# Patient Record
Sex: Female | Born: 1975 | Race: White | Hispanic: No | Marital: Married | Smoking: Never smoker
Health system: Southern US, Community
[De-identification: ages and names within clinical notes are randomized; demographics above are authoritative.]

## PROBLEM LIST (undated history)

## (undated) DIAGNOSIS — K900 Celiac disease: Secondary | ICD-10-CM

## (undated) DIAGNOSIS — A6920 Lyme disease, unspecified: Secondary | ICD-10-CM

## (undated) DIAGNOSIS — E878 Other disorders of electrolyte and fluid balance, not elsewhere classified: Secondary | ICD-10-CM

## (undated) DIAGNOSIS — I10 Essential (primary) hypertension: Secondary | ICD-10-CM

## (undated) DIAGNOSIS — E119 Type 2 diabetes mellitus without complications: Secondary | ICD-10-CM

## (undated) DIAGNOSIS — E7800 Pure hypercholesterolemia, unspecified: Secondary | ICD-10-CM

## (undated) HISTORY — PX: ABDOMINAL HYSTERECTOMY: SHX81

---

## 2014-12-10 ENCOUNTER — Other Ambulatory Visit: Payer: Self-pay | Admitting: Gastroenterology

## 2014-12-10 DIAGNOSIS — R110 Nausea: Secondary | ICD-10-CM

## 2015-01-03 ENCOUNTER — Ambulatory Visit (HOSPITAL_COMMUNITY): Payer: Self-pay

## 2015-01-03 ENCOUNTER — Encounter (HOSPITAL_COMMUNITY): Payer: Self-pay

## 2015-01-17 ENCOUNTER — Encounter (HOSPITAL_COMMUNITY): Admission: RE | Admit: 2015-01-17 | Payer: BLUE CROSS/BLUE SHIELD | Source: Ambulatory Visit

## 2015-01-17 ENCOUNTER — Ambulatory Visit (HOSPITAL_COMMUNITY): Payer: BLUE CROSS/BLUE SHIELD

## 2015-01-24 ENCOUNTER — Other Ambulatory Visit (HOSPITAL_COMMUNITY): Payer: Self-pay

## 2015-02-25 ENCOUNTER — Encounter (HOSPITAL_COMMUNITY): Admission: RE | Admit: 2015-02-25 | Payer: BLUE CROSS/BLUE SHIELD | Source: Ambulatory Visit

## 2015-02-25 ENCOUNTER — Ambulatory Visit (HOSPITAL_COMMUNITY): Admission: RE | Admit: 2015-02-25 | Payer: BLUE CROSS/BLUE SHIELD | Source: Ambulatory Visit

## 2020-12-10 ENCOUNTER — Telehealth: Payer: Self-pay

## 2020-12-10 NOTE — Telephone Encounter (Signed)
Received a referral on this pt stating she may be interested in the prevail trial. Tried to call pt, NA. LMOM asking her to return call.

## 2021-03-29 ENCOUNTER — Emergency Department (HOSPITAL_COMMUNITY): Payer: BC Managed Care – PPO

## 2021-03-29 ENCOUNTER — Other Ambulatory Visit: Payer: Self-pay

## 2021-03-29 ENCOUNTER — Emergency Department (HOSPITAL_COMMUNITY)
Admission: EM | Admit: 2021-03-29 | Discharge: 2021-03-30 | Disposition: A | Discharge status: Home / Self Care | Payer: BC Managed Care – PPO | Attending: Emergency Medicine | Admitting: Emergency Medicine

## 2021-03-29 ENCOUNTER — Encounter (HOSPITAL_COMMUNITY): Payer: Self-pay | Admitting: Emergency Medicine

## 2021-03-29 DIAGNOSIS — G510 Bell's palsy: Secondary | ICD-10-CM | POA: Diagnosis not present

## 2021-03-29 DIAGNOSIS — E119 Type 2 diabetes mellitus without complications: Secondary | ICD-10-CM | POA: Insufficient documentation

## 2021-03-29 DIAGNOSIS — H60392 Other infective otitis externa, left ear: Secondary | ICD-10-CM | POA: Diagnosis not present

## 2021-03-29 DIAGNOSIS — I10 Essential (primary) hypertension: Secondary | ICD-10-CM | POA: Insufficient documentation

## 2021-03-29 DIAGNOSIS — R220 Localized swelling, mass and lump, head: Secondary | ICD-10-CM | POA: Diagnosis present

## 2021-03-29 HISTORY — DX: Celiac disease: K90.0

## 2021-03-29 HISTORY — DX: Lyme disease, unspecified: A69.20

## 2021-03-29 HISTORY — DX: Essential (primary) hypertension: I10

## 2021-03-29 HISTORY — DX: Other disorders of electrolyte and fluid balance, not elsewhere classified: E87.8

## 2021-03-29 HISTORY — DX: Type 2 diabetes mellitus without complications: E11.9

## 2021-03-29 LAB — CBC WITH DIFFERENTIAL/PLATELET
Abs Immature Granulocytes: 0.01 10*3/uL (ref 0.00–0.07)
Basophils Absolute: 0 10*3/uL (ref 0.0–0.1)
Basophils Relative: 0 %
Eosinophils Absolute: 0.1 10*3/uL (ref 0.0–0.5)
Eosinophils Relative: 1 %
HCT: 39.6 % (ref 36.0–46.0)
Hemoglobin: 13 g/dL (ref 12.0–15.0)
Immature Granulocytes: 0 %
Lymphocytes Relative: 40 %
Lymphs Abs: 2.3 10*3/uL (ref 0.7–4.0)
MCH: 27.2 pg (ref 26.0–34.0)
MCHC: 32.8 g/dL (ref 30.0–36.0)
MCV: 82.8 fL (ref 80.0–100.0)
Monocytes Absolute: 0.3 10*3/uL (ref 0.1–1.0)
Monocytes Relative: 6 %
Neutro Abs: 3 10*3/uL (ref 1.7–7.7)
Neutrophils Relative %: 53 %
Platelets: 241 10*3/uL (ref 150–400)
RBC: 4.78 MIL/uL (ref 3.87–5.11)
RDW: 13.2 % (ref 11.5–15.5)
WBC: 5.6 10*3/uL (ref 4.0–10.5)
nRBC: 0 % (ref 0.0–0.2)

## 2021-03-29 LAB — BASIC METABOLIC PANEL
Anion gap: 11 (ref 5–15)
BUN: 16 mg/dL (ref 6–20)
CO2: 24 mmol/L (ref 22–32)
Calcium: 9.1 mg/dL (ref 8.9–10.3)
Chloride: 100 mmol/L (ref 98–111)
Creatinine, Ser: 0.75 mg/dL (ref 0.44–1.00)
GFR, Estimated: >60 mL/min (ref >60)
Glucose, Bld: 352 mg/dL — ABNORMAL HIGH (ref 70–99)
Potassium: 3.4 mmol/L — ABNORMAL LOW (ref 3.5–5.1)
Sodium: 135 mmol/L (ref 135–145)

## 2021-03-29 IMAGING — CT CT MAXILLOFACIAL W/ CM
3 of 4 series · 15 of 47 positions shown, 18 images · IV contrast (Omnipaque or Isovue)
Comparison: None.

CLINICAL DATA: Left-sided facial swelling and droop

EXAM:
CT NECK WITH CONTRAST
CT MAXILLOFACIAL WITH CONTRAST
TECHNIQUE: Multidetector CT imaging of the face and neck was performed using
the standard protocol following the bolus administration of
intravenous contrast.

[Series 2: max soft · axial · 0.46mm/px · z∈[-766,-608]mm · 9 of 93 slices shown, 12 images]
[im 7/93  brain]
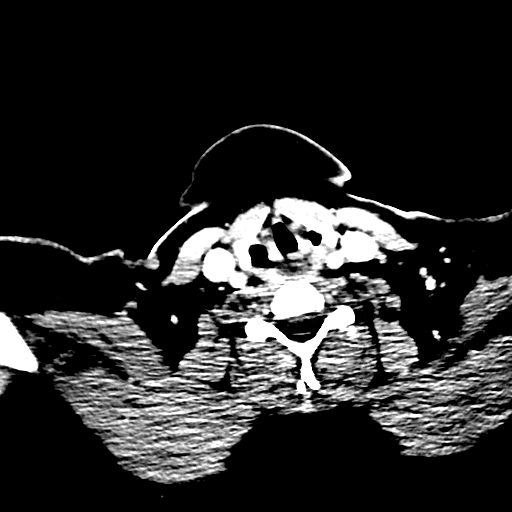
[im 7/93  bone]
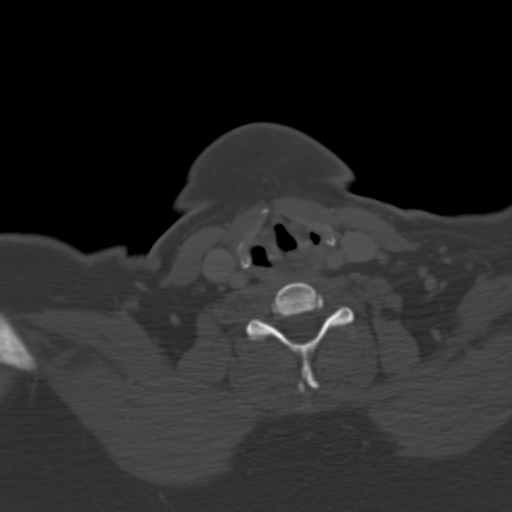
[im 16/93  bone]
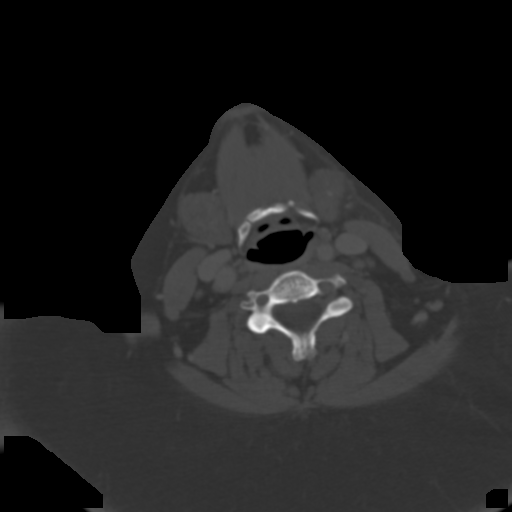
[im 26/93  bone]
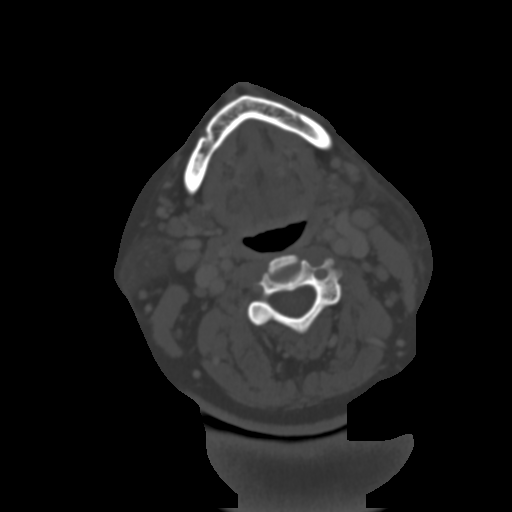
[im 35/93  bone]
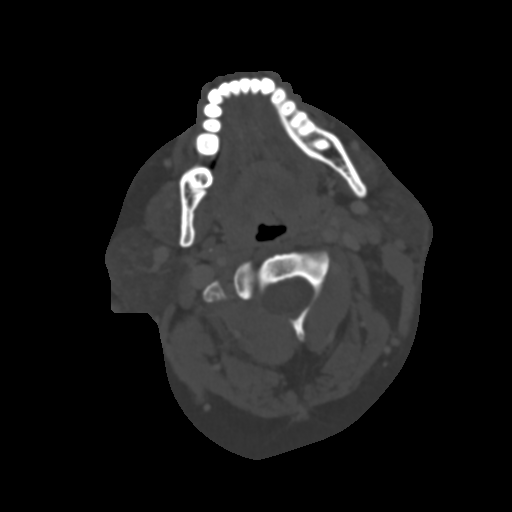
[im 48/93  brain]
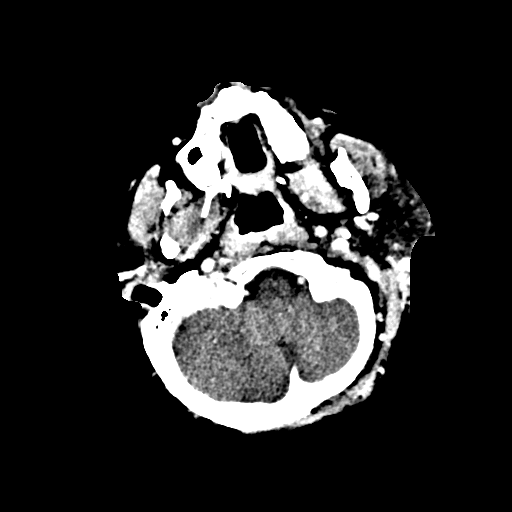
[im 48/93  bone]
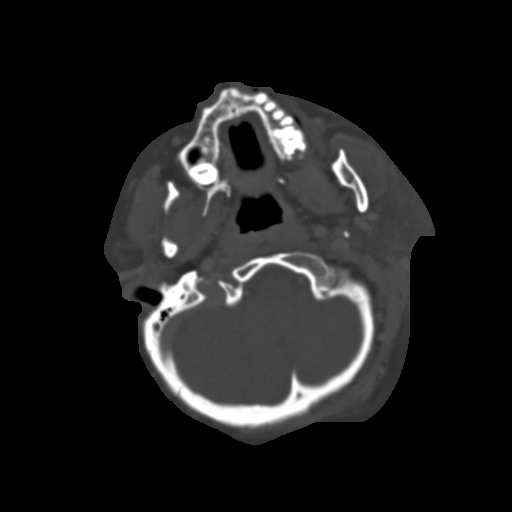
[im 58/93  bone]
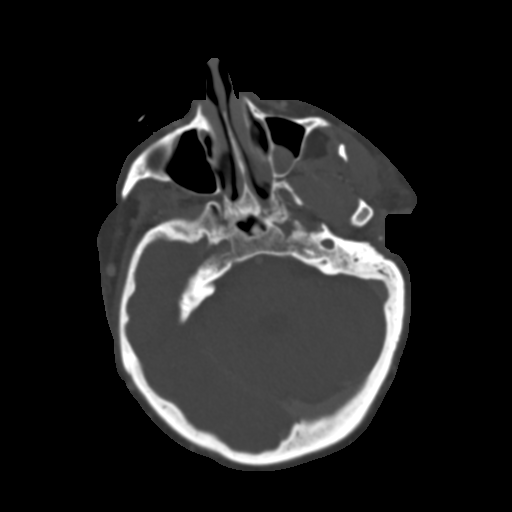
[im 67/93  bone]
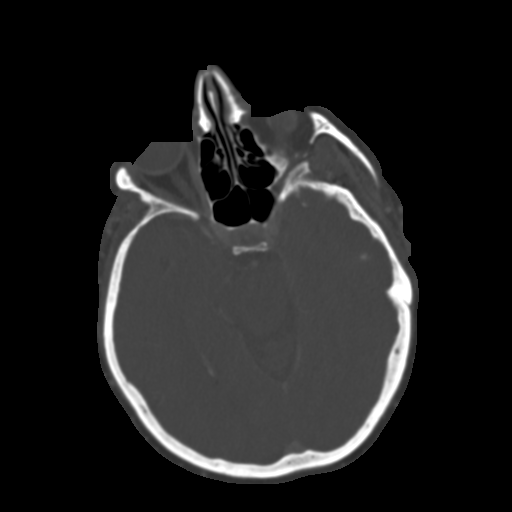
[im 77/93  bone]
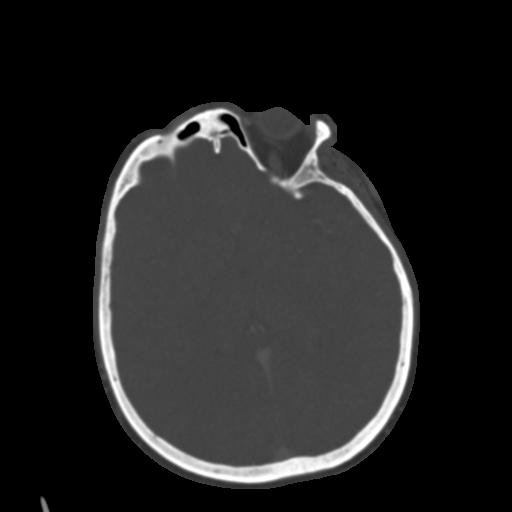
[im 86/93  brain]
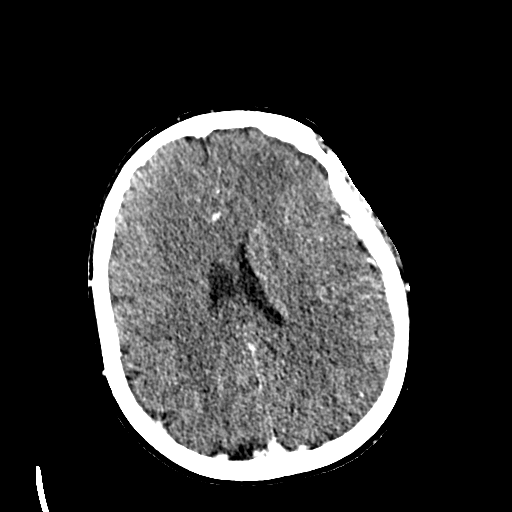
[im 86/93  bone]
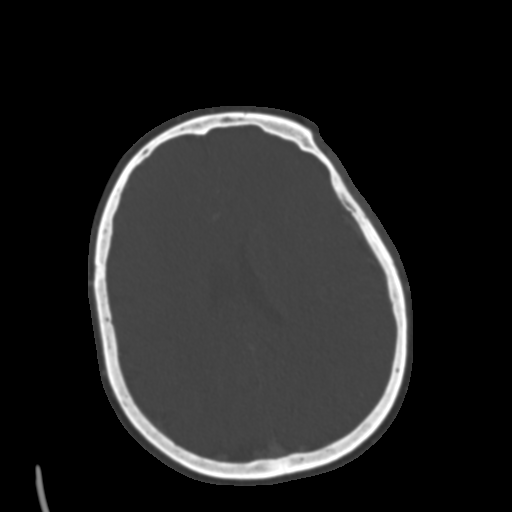

[Series 4: coronal soft · coronal · 0.42mm/px · 3 of 111 slices shown]
[im 37/111  bone]
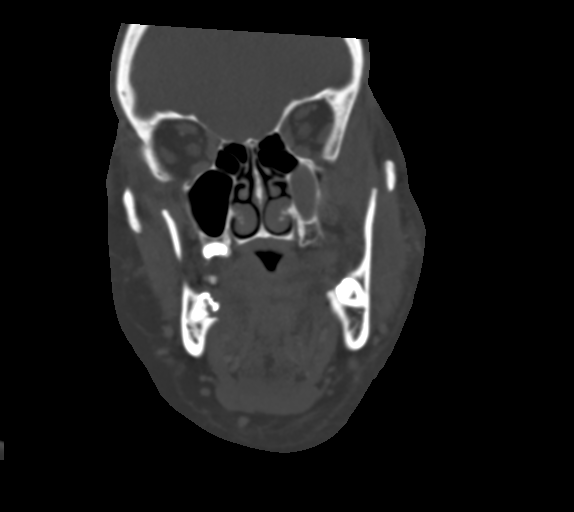
[im 49/111  bone]
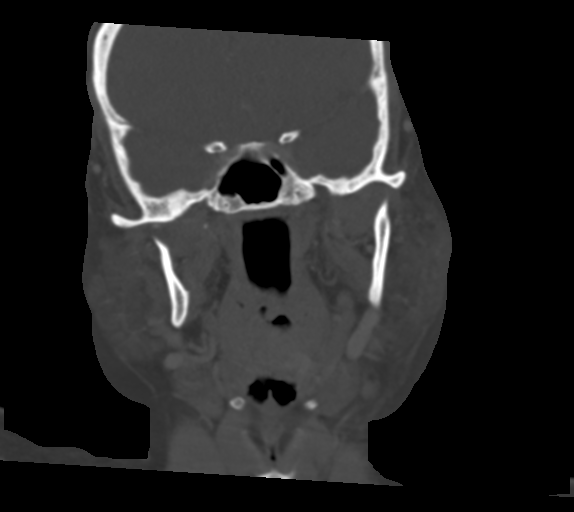
[im 62/111  bone]
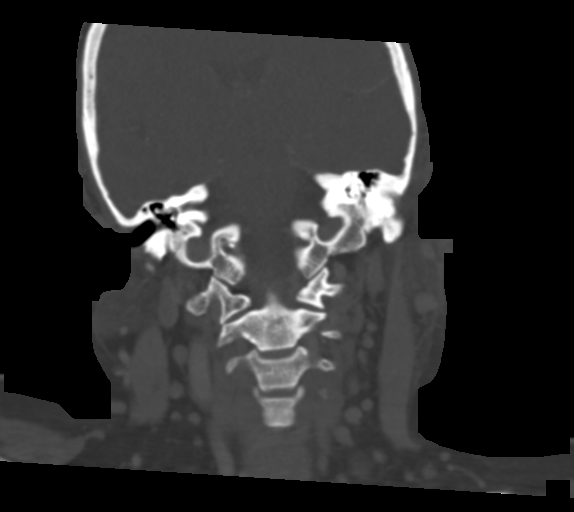

[Series 7: sagittal bone · sagittal · 0.42mm/px · 3 of 107 slices shown]
[im 4/107  bone]
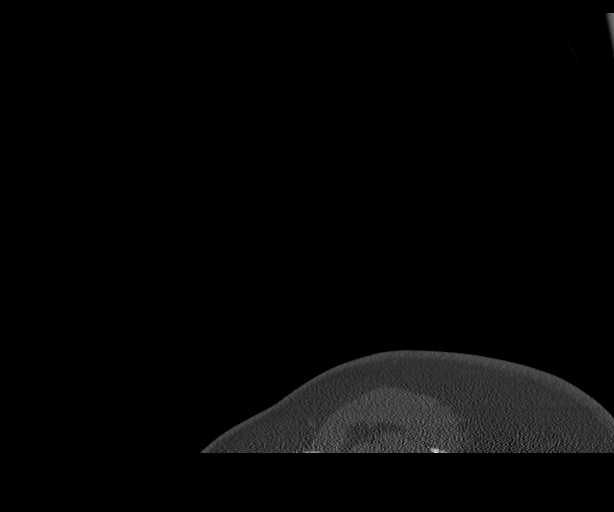
[im 36/107  bone]
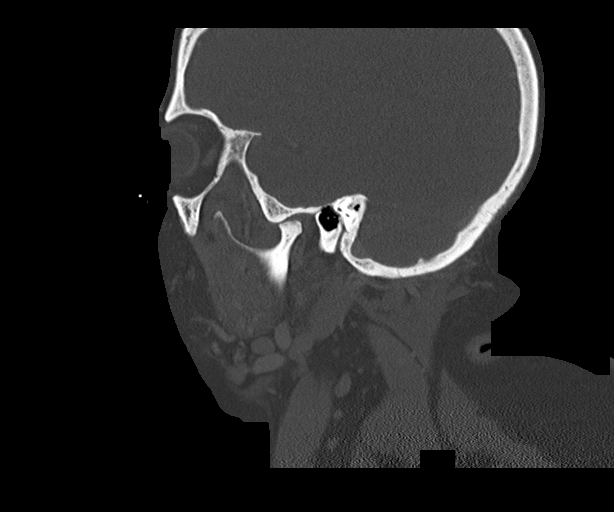
[im 68/107  bone]
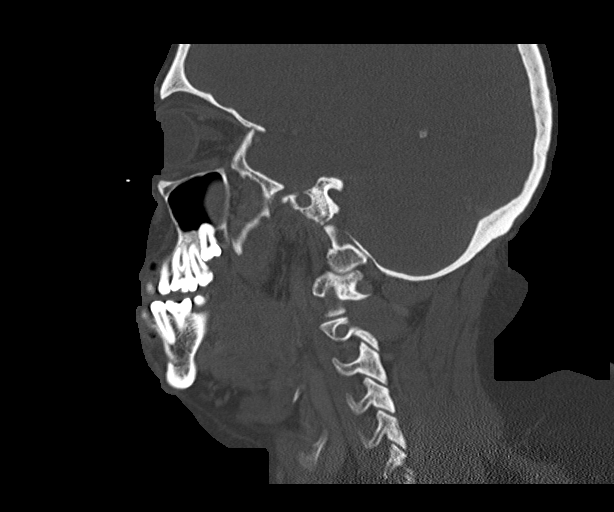

[15 of 47 positions shown; findings below may reference images not displayed]

RADIATION DOSE REDUCTION: This exam was performed according to the
departmental dose-optimization program which includes automated
exposure control, adjustment of the mA and/or kV according to
patient size and/or use of iterative reconstruction technique.

CONTRAST:  75mL OMNIPAQUE IOHEXOL 300 MG/ML  SOLN
FINDINGS: PHARYNX AND LARYNX: The nasopharynx, oropharynx and larynx are
normal. Visible portions of the oral cavity, tongue base and floor
of mouth are normal. Normal epiglottis, vallecula and pyriform
sinuses. The larynx is normal. No retropharyngeal abscess, effusion
or lymphadenopathy.

SALIVARY GLANDS: Normal parotid, submandibular and sublingual
glands.

THYROID: Normal.

LYMPH NODES: No enlarged or abnormal density lymph nodes.

VASCULAR: Major cervical vessels are patent.

LIMITED INTRACRANIAL: Normal.

ORBITS: Normal.

MASTOIDS AND PARANASAL SINUSES: Developmentally underpneumatized
left mastoid without coalescence or effusion. Normal right mastoid
air cells. No middle ear effusion. Paranasal sinuses are clear aside
from a left maxillary retention cyst.

SKELETON: No bony spinal canal stenosis. No lytic or blastic
lesions.

UPPER CHEST: Clear.

OTHER: Thickening and mild hyperenhancement of the left external
auditory canal soft tissues.
IMPRESSION: 1. Thickening and mild hyperenhancement of the left external
auditory canal soft tissues, likely otitis externa.
2. No mastoid or middle ear effusion.
3. No facial or cervical abscess or fluid collection.

## 2021-03-29 MED ORDER — SODIUM CHLORIDE 0.9 % IV SOLN
3.0000 g | Freq: Once | INTRAVENOUS | Status: DC
  Filled 2021-03-29: qty 8

## 2021-03-29 MED ORDER — PREDNISONE 10 MG PO TABS
60.0000 mg | ORAL_TABLET | Freq: Once | ORAL | Status: AC
  Administered 2021-03-29: 60 mg via ORAL
  Filled 2021-03-29: qty 1

## 2021-03-29 MED ORDER — LEVOFLOXACIN 750 MG PO TABS
750.0000 mg | ORAL_TABLET | Freq: Once | ORAL | Status: AC
  Administered 2021-03-29: 750 mg via ORAL
  Filled 2021-03-29: qty 1

## 2021-03-29 MED ORDER — IBUPROFEN 800 MG PO TABS
800.0000 mg | ORAL_TABLET | Freq: Once | ORAL | Status: AC
  Administered 2021-03-29: 800 mg via ORAL
  Filled 2021-03-29: qty 1

## 2021-03-29 MED ORDER — VALACYCLOVIR HCL 500 MG PO TABS
1000.0000 mg | ORAL_TABLET | Freq: Three times a day (TID) | ORAL | Status: DC
  Administered 2021-03-29 – 2021-03-30 (×2): 1000 mg via ORAL
  Filled 2021-03-29 (×2): qty 2

## 2021-03-29 MED ORDER — IOHEXOL 300 MG/ML  SOLN
75.0000 mL | Freq: Once | INTRAMUSCULAR | Status: AC | PRN
  Administered 2021-03-29: 75 mL via INTRAVENOUS

## 2021-03-29 MED ORDER — OFLOXACIN 0.3 % OP SOLN
5.0000 [drp] | Freq: Every day | OPHTHALMIC | Status: DC
  Administered 2021-03-30: 5 [drp] via OTIC
  Filled 2021-03-29: qty 5

## 2021-03-29 NOTE — ED Triage Notes (Signed)
Pt reports left sided facial swelling and droop x 3 days; EDP at bedside  ?

## 2021-03-29 NOTE — ED Provider Notes (Signed)
Care of the patient assumed at the change of shift. Here with R facial/ear swelling, R facial droop. Pending MRI in the AM to further evaluation. Patient currently resting comfortably and denies any needs.  ?  ?Truddie Hidden, MD ?03/29/21 2355 ? ?

## 2021-03-29 NOTE — ED Notes (Signed)
Pt to CT

## 2021-03-29 NOTE — ED Provider Notes (Signed)
?[facility] ?Provider Note ? ? ?CSN: [id number] ?Arrival date & time: 03/29/21  2103 ? ?  ? ?History ? ?Chief Complaint  ?Patient presents with  ? Facial Swelling  ? ? ?Kathleen Hale is a 46 y.o. female. ? ?MRI IAC and MR brain without and with ?Pred 60 q 7 days  ?Valtrex 100mg  tid ? ? ? ?  ? ?Home Medications ?Prior to Admission medications   ?Not on File  ?   ? ?Allergies    ?Penicillins and Buspirone   ? ?Review of Systems   ?Review of Systems  ?All other systems reviewed and are negative. ? ?Physical Exam ?Updated Vital Signs ?BP (!) 162/94   Pulse 99   Temp 99.7 ?F (37.6 ?C) (Oral)   Resp 15   Ht 1.575 m (5\' 2" )   Wt 99.8 kg   SpO2 99%   BMI 40.24 kg/m?  ?Physical Exam ?Vitals and nursing note reviewed.  ?Constitutional:   ?   General: She is not in acute distress. ?   Appearance: She is well-developed.  ?HENT:  ?   Head: Normocephalic and atraumatic.  ?   Ears:  ?   Comments: The right ear and tympanic membrane are totally normal, the left ear has edema and tenderness surrounding the external auditory canal which is nearly completely shut, there is tenderness in the periauricular tissue including some periauricular lymphadenopathy which is present with tenderness, there is also tenderness over the mastoid. ?   Nose: Nose normal. No congestion or rhinorrhea.  ?   Mouth/Throat:  ?   Mouth: Mucous membranes are moist.  ?   Pharynx: No oropharyngeal exudate or posterior oropharyngeal erythema.  ?Eyes:  ?   General: No scleral icterus.    ?   Right eye: No discharge.     ?   Left eye: No discharge.  ?   Conjunctiva/sclera: Conjunctivae normal.  ?   Pupils: Pupils are equal, round, and reactive to light.  ?Neck:  ?   Thyroid: No thyromegaly.  ?   Vascular: No JVD.  ?   Comments: The neck is very supple, there is normal pulses, normal range of motion, no lymphadenopathy of the cervical chains ?Cardiovascular:  ?   Rate and Rhythm: Normal rate and regular rhythm.  ?   Heart sounds: Normal  heart sounds. No murmur heard. ?  No friction rub. No gallop.  ?Pulmonary:  ?   Effort: Pulmonary effort is normal. No respiratory distress.  ?   Breath sounds: Normal breath sounds. No wheezing or rales.  ?Chest:  ?   Chest wall: No tenderness.  ?Abdominal:  ?   General: Bowel sounds are normal. There is no distension.  ?   Palpations: Abdomen is soft. There is no mass.  ?   Tenderness: There is no abdominal tenderness.  ?Musculoskeletal:     ?   General: No tenderness. Normal range of motion.  ?   Cervical back: Normal range of motion and neck supple.  ?Lymphadenopathy:  ?   Cervical: No cervical adenopathy.  ?Skin: ?   General: Skin is warm and dry.  ?   Findings: No erythema or rash.  ?Neurological:  ?   Mental Status: She is alert.  ?   Coordination: Coordination normal.  ?   Comments: Obvious facial droop on the left with inability to raise the left forehead, inability to close the left eye, facial droop  ?Psychiatric:     ?   Behavior:  Behavior normal.  ? ? ?ED Results / Procedures / Treatments   ?Labs ?(all labs ordered are listed, but only abnormal results are displayed) ?Labs Reviewed  ?BASIC METABOLIC PANEL - Abnormal; Notable for the following components:  ?    Result Value  ? Potassium 3.4 (*)   ? Glucose, Bld 352 (*)   ? All other components within normal limits  ?CBC WITH DIFFERENTIAL/PLATELET  ? ? ?EKG ?None ? ?Radiology ?CT Soft Tissue Neck W Contrast ? ?Result Date: 03/29/2021 ?CLINICAL DATA:  Left-sided facial swelling and droop EXAM: CT NECK WITH CONTRAST CT MAXILLOFACIAL WITH CONTRAST TECHNIQUE: Multidetector CT imaging of the face and neck was performed using the standard protocol following the bolus administration of intravenous contrast. RADIATION DOSE REDUCTION: This exam was performed according to the departmental dose-optimization program which includes automated exposure control, adjustment of the mA and/or kV according to patient size and/or use of iterative reconstruction technique.  CONTRAST:  75mL OMNIPAQUE IOHEXOL 300 MG/ML  SOLN COMPARISON:  None. FINDINGS: PHARYNX AND LARYNX: The nasopharynx, oropharynx and larynx are normal. Visible portions of the oral cavity, tongue base and floor of mouth are normal. Normal epiglottis, vallecula and pyriform sinuses. The larynx is normal. No retropharyngeal abscess, effusion or lymphadenopathy. SALIVARY GLANDS: Normal parotid, submandibular and sublingual glands. THYROID: Normal. LYMPH NODES: No enlarged or abnormal density lymph nodes. VASCULAR: Major cervical vessels are patent. LIMITED INTRACRANIAL: Normal. ORBITS: Normal. MASTOIDS AND PARANASAL SINUSES: Developmentally underpneumatized left mastoid without coalescence or effusion. Normal right mastoid air cells. No middle ear effusion. Paranasal sinuses are clear aside from a left maxillary retention cyst. SKELETON: No bony spinal canal stenosis. No lytic or blastic lesions. UPPER CHEST: Clear. OTHER: Thickening and mild hyperenhancement of the left external auditory canal soft tissues. IMPRESSION: 1. Thickening and mild hyperenhancement of the left external auditory canal soft tissues, likely otitis externa. 2. No mastoid or middle ear effusion. 3. No facial or cervical abscess or fluid collection. Electronically Signed   By: Deatra RobinsonKevin  Herman M.D.   On: 03/29/2021 22:43  ? ?CT Maxillofacial W Contrast ? ?Result Date: 03/29/2021 ?CLINICAL DATA:  Left-sided facial swelling and droop EXAM: CT NECK WITH CONTRAST CT MAXILLOFACIAL WITH CONTRAST TECHNIQUE: Multidetector CT imaging of the face and neck was performed using the standard protocol following the bolus administration of intravenous contrast. RADIATION DOSE REDUCTION: This exam was performed according to the departmental dose-optimization program which includes automated exposure control, adjustment of the mA and/or kV according to patient size and/or use of iterative reconstruction technique. CONTRAST:  75mL OMNIPAQUE IOHEXOL 300 MG/ML  SOLN  COMPARISON:  None. FINDINGS: PHARYNX AND LARYNX: The nasopharynx, oropharynx and larynx are normal. Visible portions of the oral cavity, tongue base and floor of mouth are normal. Normal epiglottis, vallecula and pyriform sinuses. The larynx is normal. No retropharyngeal abscess, effusion or lymphadenopathy. SALIVARY GLANDS: Normal parotid, submandibular and sublingual glands. THYROID: Normal. LYMPH NODES: No enlarged or abnormal density lymph nodes. VASCULAR: Major cervical vessels are patent. LIMITED INTRACRANIAL: Normal. ORBITS: Normal. MASTOIDS AND PARANASAL SINUSES: Developmentally underpneumatized left mastoid without coalescence or effusion. Normal right mastoid air cells. No middle ear effusion. Paranasal sinuses are clear aside from a left maxillary retention cyst. SKELETON: No bony spinal canal stenosis. No lytic or blastic lesions. UPPER CHEST: Clear. OTHER: Thickening and mild hyperenhancement of the left external auditory canal soft tissues. IMPRESSION: 1. Thickening and mild hyperenhancement of the left external auditory canal soft tissues, likely otitis externa. 2. No mastoid or middle ear effusion.  3. No facial or cervical abscess or fluid collection. Electronically Signed   By: Deatra Robinson M.D.   On: 03/29/2021 22:43   ? ?Procedures ?Procedures  ? ? ?Medications Ordered in ED ?Medications  ?predniSONE (DELTASONE) tablet 60 mg (has no administration in time range)  ?valACYclovir (VALTREX) tablet 1,000 mg (has no administration in time range)  ?ibuprofen (ADVIL) tablet 800 mg (has no administration in time range)  ?levofloxacin (LEVAQUIN) tablet 750 mg (has no administration in time range)  ?ofloxacin (OCUFLOX) 0.3 % ophthalmic solution 5 drop (has no administration in time range)  ?Ampicillin-Sulbactam (UNASYN) 3 g in sodium chloride 0.9 % 100 mL IVPB (0 g Intravenous Hold 03/29/21 2133)  ?iohexol (OMNIPAQUE) 300 MG/ML solution 75 mL (75 mLs Intravenous Contrast Given 03/29/21 2232)  ? ? ?ED Course/  Medical Decision Making/ A&P ?  ?                        ?Medical Decision Making ?Amount and/or Complexity of Data Reviewed ?Labs: ordered. ?Radiology: ordered. ? ?Risk ?Prescription drug management. ? ? ?This patient p

## 2021-03-29 NOTE — Discharge Instructions (Addendum)
Call your primary care doctor or specialist as discussed in the next 2-3 days.   Return immediately back to the ER if:  Your symptoms worsen within the next 12-24 hours. You develop new symptoms such as new fevers, persistent vomiting, new pain, shortness of breath, or new weakness or numbness, or if you have any other concerns.  

## 2021-03-30 ENCOUNTER — Emergency Department (HOSPITAL_COMMUNITY): Payer: BC Managed Care – PPO

## 2021-03-30 LAB — CBG MONITORING, ED: Glucose-Capillary: 298 mg/dL — ABNORMAL HIGH (ref 70–99)

## 2021-03-30 IMAGING — MR MR HEAD WO/W CM
14 of 20 series · 29 of 48 positions shown · IV contrast (gadavist)
Comparison: CT face and neck yesterday

CLINICAL DATA: 45-year-old female with left ear pain. Weakness and
facial droop for 3 days. Left pinna and EAC soft tissue thickening
on CT yesterday.

EXAM:
MRI HEAD WITHOUT AND WITH CONTRAST
TECHNIQUE: Multiplanar, multiecho pulse sequences of the brain and surrounding
structures were obtained without and with intravenous contrast.
CONTRAST:  10mL GADAVIST GADOBUTROL 1 MMOL/ML IV SOLN

[Series 5: DWI · axial · 4.0mm · 0.88mm/px · z∈[-78,+55]mm · 2 of 36 slices shown (1 of 6)]
[im 1/36]
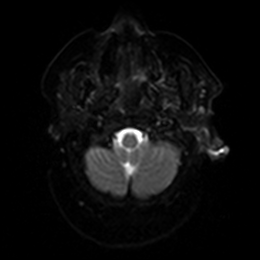
[im 36/36]
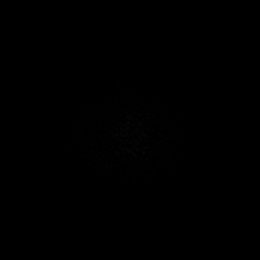

[Series 5: DWI · axial · 4.0mm · 0.88mm/px · z∈[-78,+55]mm · 3 of 36 slices shown (2 of 6)]
[im 1/36]
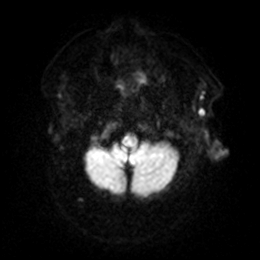
[im 18/36]
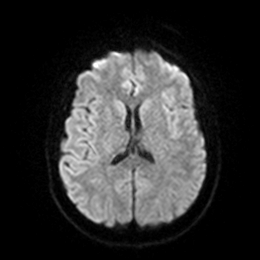
[im 36/36]
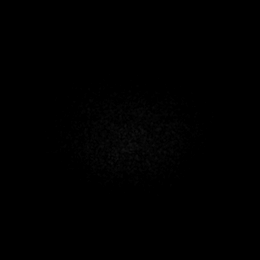

[Series 6: DWI · axial · 4.0mm · 0.88mm/px · z∈[-78,+52]mm · 3 of 35 slices shown (3 of 6)]
[im 1/35]
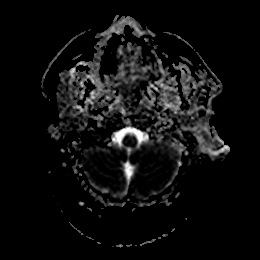
[im 18/35]
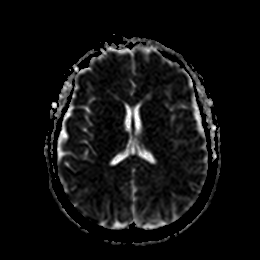
[im 35/35]
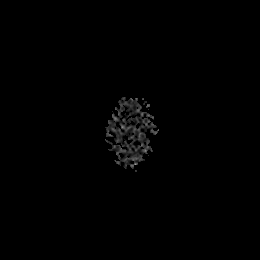

[Series 7: DWI · coronal · 5.0mm · 0.88mm/px · 2 of 28 slices shown (4 of 6)]
[im 1/28]
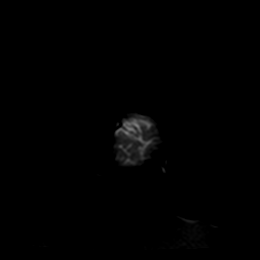
[im 28/28]
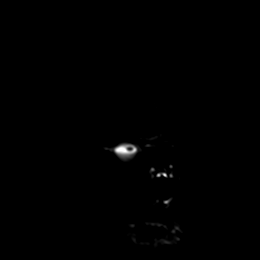

[Series 7: DWI · coronal · 5.0mm · 0.88mm/px · 2 of 28 slices shown (5 of 6)]
[im 1/28]
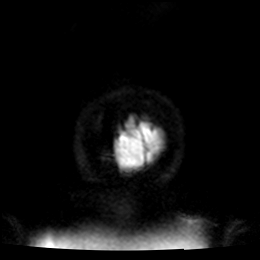
[im 28/28]
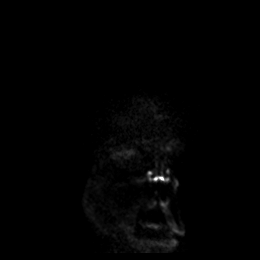

[Series 8: DWI · coronal · 5.0mm · 0.88mm/px · 2 of 28 slices shown (6 of 6)]
[im 1/28]
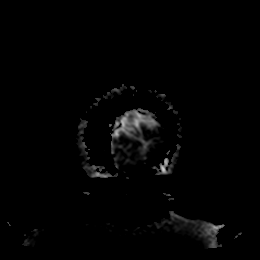
[im 28/28]
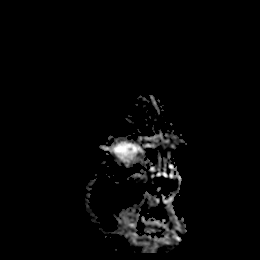

[Series 9: T1 · sagittal · 5.0mm · 0.94mm/px · 2 of 21 slices shown]
[im 1/21]
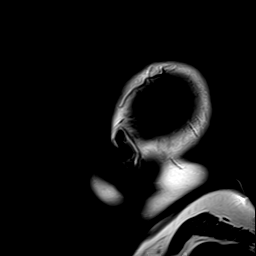
[im 21/21]
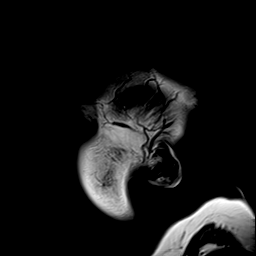

[Series 10: T2 · axial · 5.0mm · 0.72mm/px · z∈[-75,+52]mm · 2 of 20 slices shown (1 of 2)]
[im 1/20]
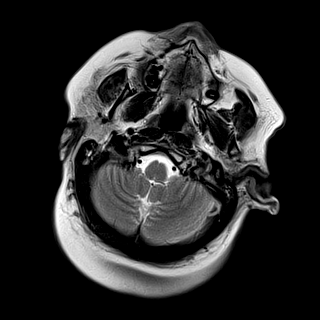
[im 20/20]
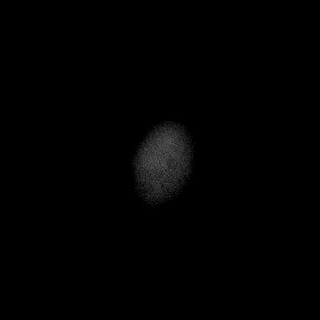

[Series 12: FLAIR · axial · 4.0mm · 0.43mm/px · z∈[-79,+55]mm · 3 of 36 slices shown (1 of 2)]
[im 1/36]
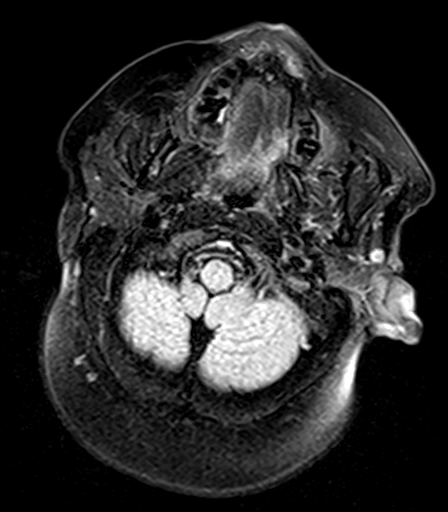
[im 18/36]
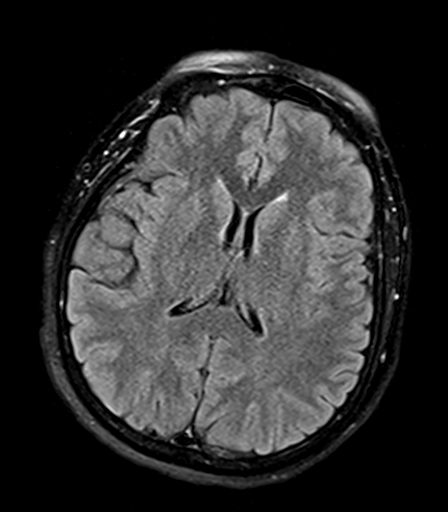
[im 36/36]
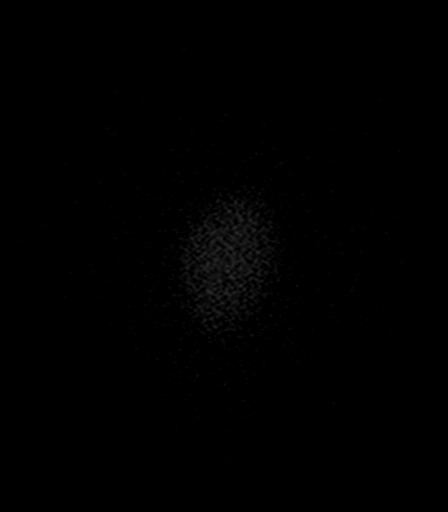

[Series 14: FLAIR · sagittal · 5.0mm · 0.94mm/px · 2 of 19 slices shown (2 of 2)]
[im 1/19]
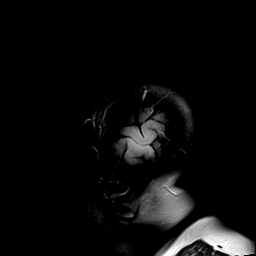
[im 19/19]
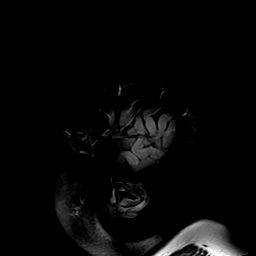

[Series 15: T2 · coronal · 5.0mm · 0.72mm/px · 2 of 28 slices shown (2 of 2)]
[im 1/28]
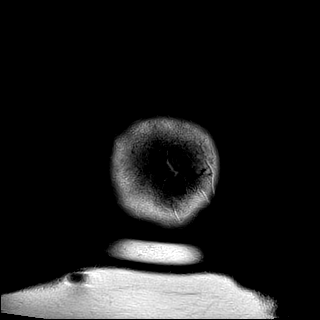
[im 28/28]
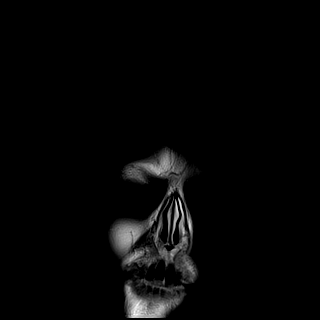

[Series 19: T1 post-contrast · axial · 3.0mm · 0.21mm/px · 1 of 15 slices shown (1 of 3)]
[im 1/15]
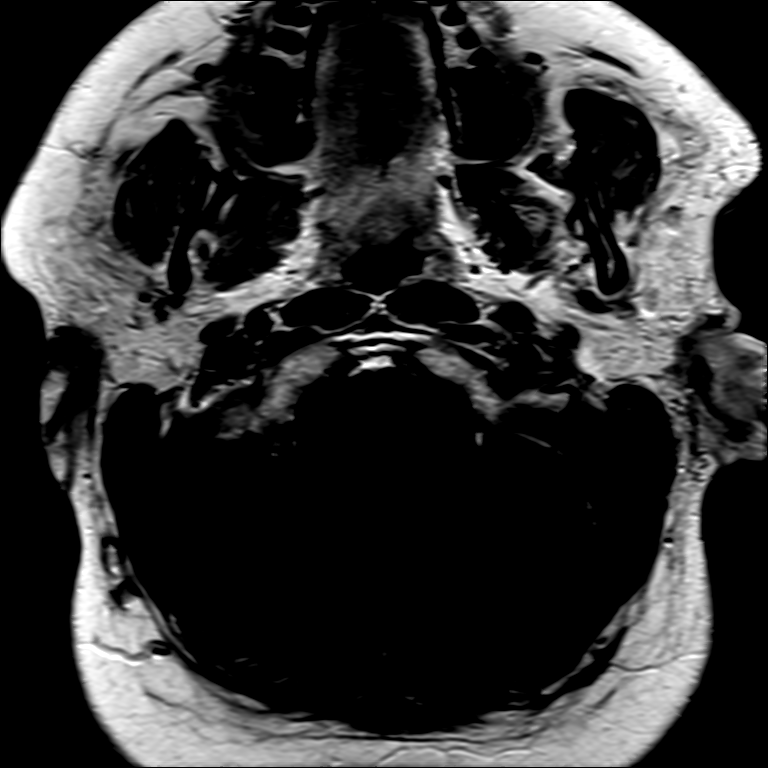

[Series 20: T1 post-contrast · coronal · 3.0mm · 0.21mm/px · 1 of 13 slices shown (2 of 3)]
[im 1/13]
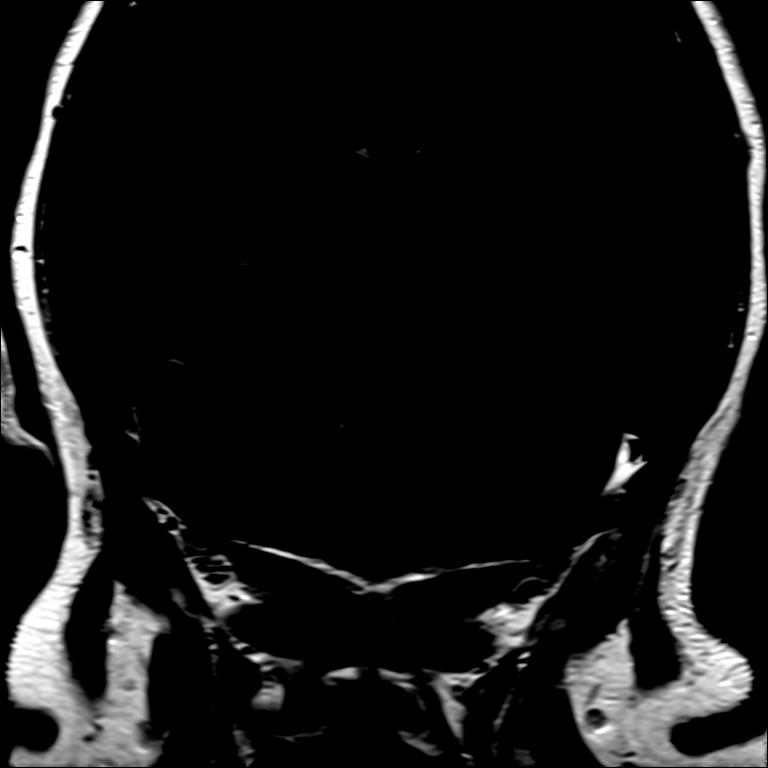

[Series 22: T1 post-contrast · coronal · 5.0mm · 0.34mm/px · 2 of 26 slices shown (3 of 3)]
[im 1/26]
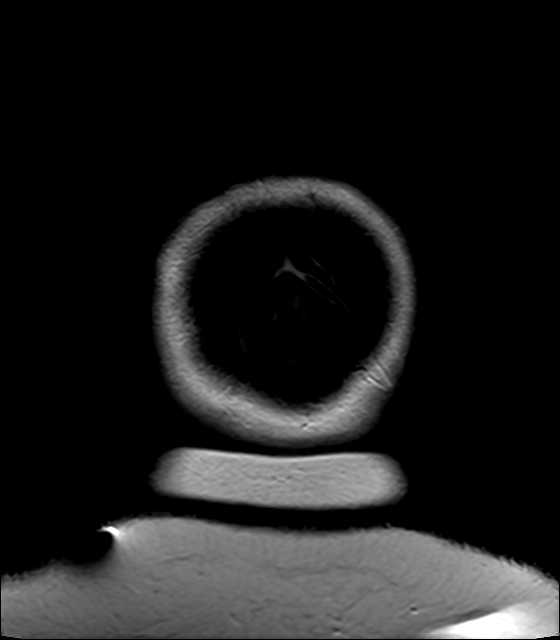
[im 26/26]
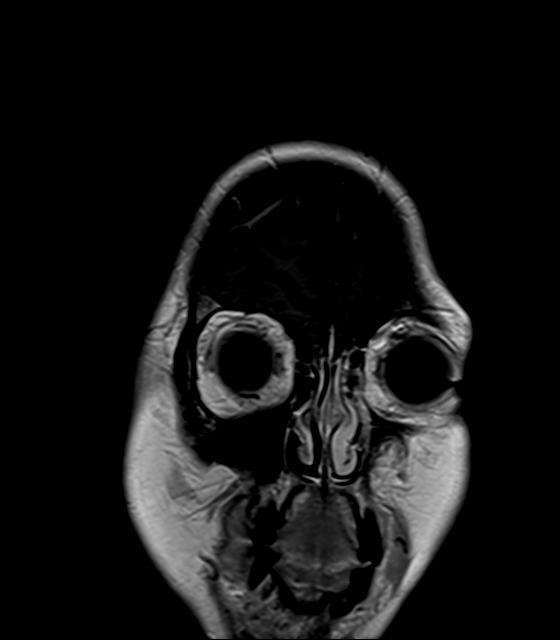

[29 of 48 positions shown; findings below may reference images not displayed]

FINDINGS: Brain: No restricted diffusion to suggest acute infarction. No
midline shift, mass effect, evidence of mass lesion,
ventriculomegaly, extra-axial collection or acute intracranial
hemorrhage. Cervicomedullary junction and pituitary are within
normal limits. Gray and white matter signal is within normal limits
for age throughout the brain. No abnormal intracranial enhancement
or dural thickening identified. Internal auditory details are below.

Vascular: Major intracranial vascular flow voids are preserved. The
major dural venous sinuses are enhancing and appear to be patent,
including the left sigmoid and IJ bulb.

Skull and upper cervical spine: Negative visible cervical spine.
Visualized bone marrow signal is within normal limits.

Sinuses/Orbits: Negative orbits. Paranasal sinuses are clear aside
from a left maxillary sinus retention cyst.

Other: Dedicated internal auditory imaging.

Normal cerebellopontine angles. Right side normal cisternal and
intracanalicular 7th and 8th cranial nerve segments. And there is
symmetric T2 signal in the bilateral cochlea and vestibular
structures. However, there is subtle asymmetric enhancement along
the course of the left intracanalicular 7th and/or 8th cranial
nerves (series 19, image 8) and conspicuous asymmetric enhancement
of the left 7th nerve anterior geniculate ganglion.

Trace bilateral mastoid air cell fluid, similar on both sides. No
abnormal enhancement on the right side. Bilateral stylomastoid
foramina are within normal limits, no parotid gland mass identified.
But there is an abnormal moderate to severe thickening and
enhancement of the left pinna and superficial external auditory
canal which demonstrates mildly abnormal diffusion. Left tympanic
cavity seems to remain clear.
IMPRESSION: 1. Abnormal enhancement of the left 7th nerve intracanalicular
segment and anterior geniculate ganglion superimposed on the
superficial left pinna and external auditory soft tissue swelling
and inflammation. Favor this is 7th Nerve Zoster (ARMAND
syndrome). If the patient is diabetic or immunocompromised also
consider a Pseudomonas external otitis.

2. Otherwise negative internal auditory imaging and normal for age
MRI appearance of the brain.

## 2021-03-30 IMAGING — MR MR BRAIN/TEMPORAL BONE/IAC
14 of 20 series · 29 of 48 positions shown · IV contrast (10 ml Gadavist)
Comparison: CT face and neck yesterday

CLINICAL DATA: 45-year-old female with left ear pain. Weakness and
facial droop for 3 days. Left pinna and EAC soft tissue thickening
on CT yesterday.

EXAM:
MRI HEAD WITHOUT AND WITH CONTRAST
TECHNIQUE: Multiplanar, multiecho pulse sequences of the brain and surrounding
structures were obtained without and with intravenous contrast.
CONTRAST:  10mL GADAVIST GADOBUTROL 1 MMOL/ML IV SOLN

[Series 5: DWI · axial · 4.0mm · 0.88mm/px · z∈[-78,+55]mm · 2 of 36 slices shown (1 of 6)]
[im 1/36]
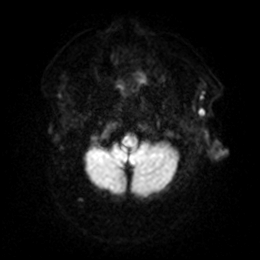
[im 36/36]
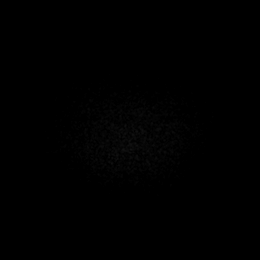

[Series 5: DWI · axial · 4.0mm · 0.88mm/px · z∈[-78,+55]mm · 3 of 36 slices shown (2 of 6)]
[im 1/36]
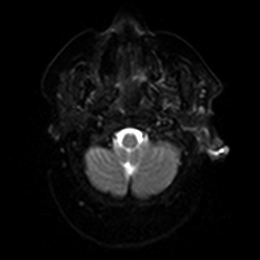
[im 18/36]
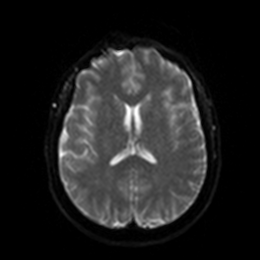
[im 36/36]
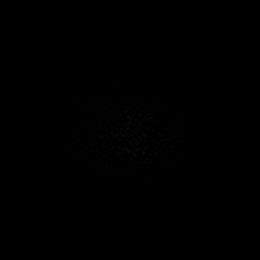

[Series 6: DWI · axial · 4.0mm · 0.88mm/px · z∈[-78,+52]mm · 3 of 35 slices shown (3 of 6)]
[im 1/35]
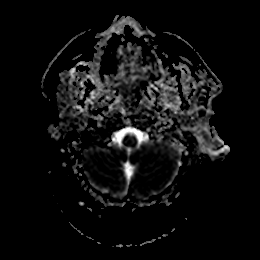
[im 18/35]
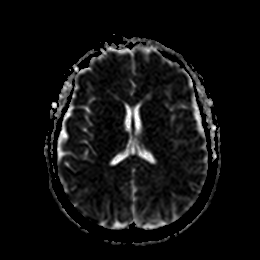
[im 35/35]
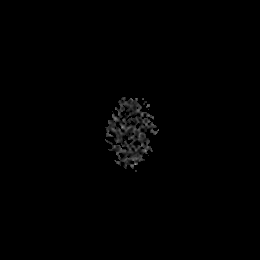

[Series 7: DWI · coronal · 5.0mm · 0.88mm/px · 2 of 28 slices shown (4 of 6)]
[im 1/28]
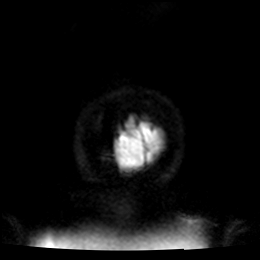
[im 28/28]
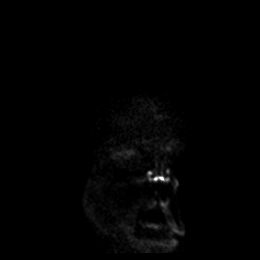

[Series 7: DWI · coronal · 5.0mm · 0.88mm/px · 2 of 28 slices shown (5 of 6)]
[im 1/28]
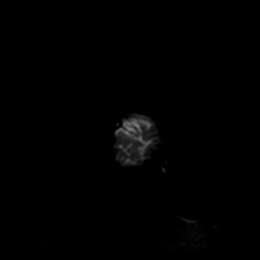
[im 28/28]
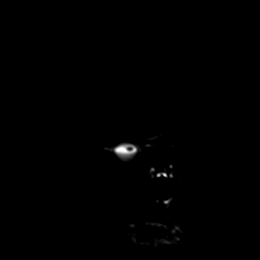

[Series 8: DWI · coronal · 5.0mm · 0.88mm/px · 2 of 28 slices shown (6 of 6)]
[im 1/28]
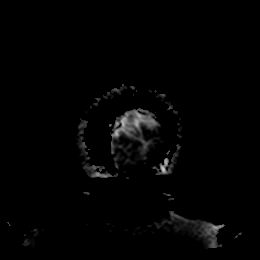
[im 28/28]
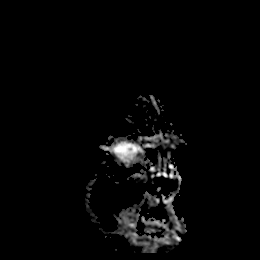

[Series 9: T1 · sagittal · 5.0mm · 0.94mm/px · 2 of 21 slices shown]
[im 1/21]
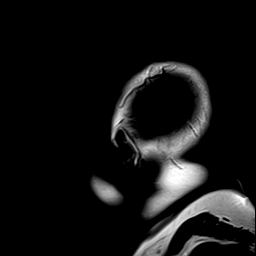
[im 21/21]
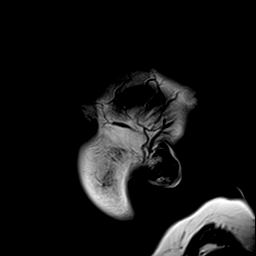

[Series 10: T2 · axial · 5.0mm · 0.72mm/px · z∈[-75,+52]mm · 2 of 20 slices shown (1 of 2)]
[im 1/20]
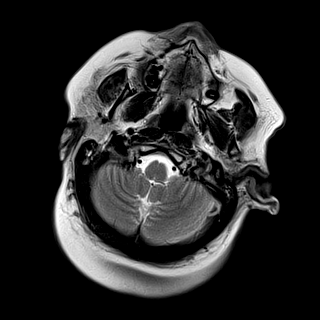
[im 20/20]
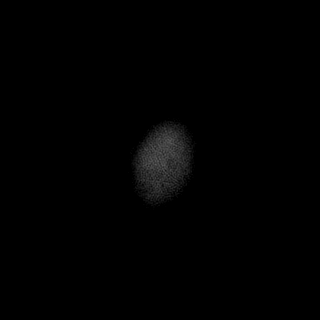

[Series 12: FLAIR · axial · 4.0mm · 0.43mm/px · z∈[-79,+55]mm · 3 of 36 slices shown (1 of 2)]
[im 1/36]
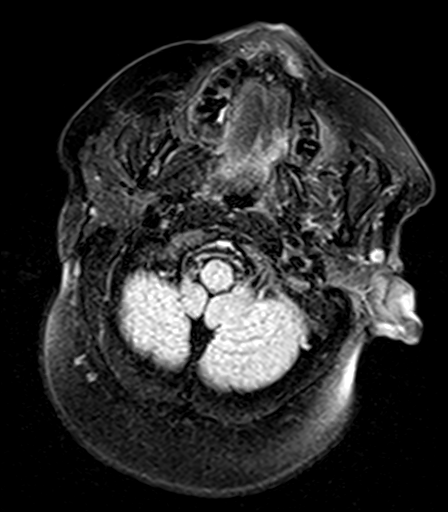
[im 18/36]
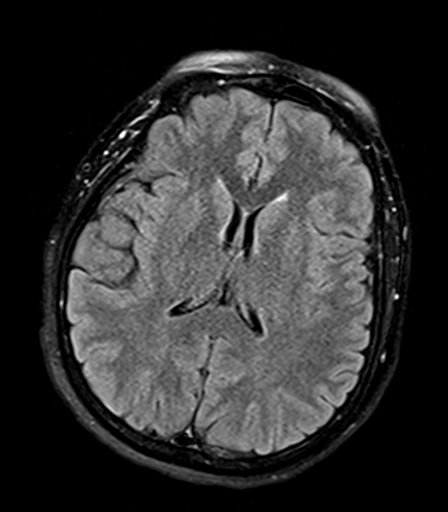
[im 36/36]
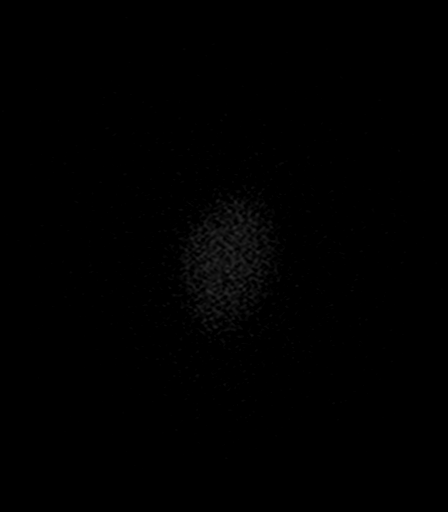

[Series 14: FLAIR · sagittal · 5.0mm · 0.94mm/px · 2 of 19 slices shown (2 of 2)]
[im 1/19]
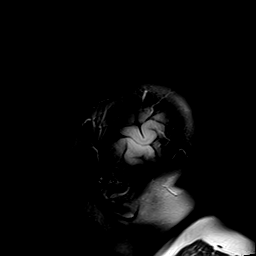
[im 19/19]
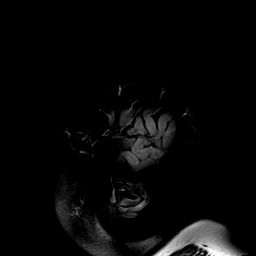

[Series 15: T2 · coronal · 5.0mm · 0.72mm/px · 2 of 28 slices shown (2 of 2)]
[im 1/28]
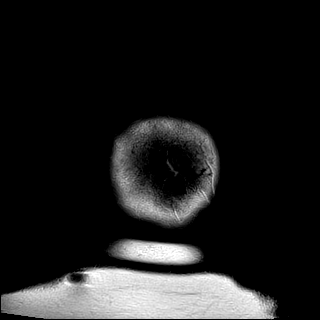
[im 28/28]
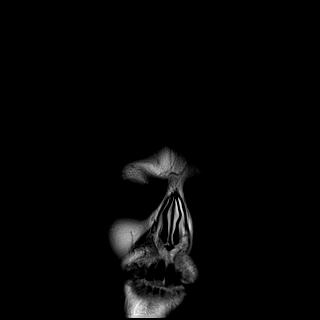

[Series 19: T1 post-contrast · axial · 3.0mm · 0.21mm/px · 1 of 15 slices shown (1 of 3)]
[im 1/15]
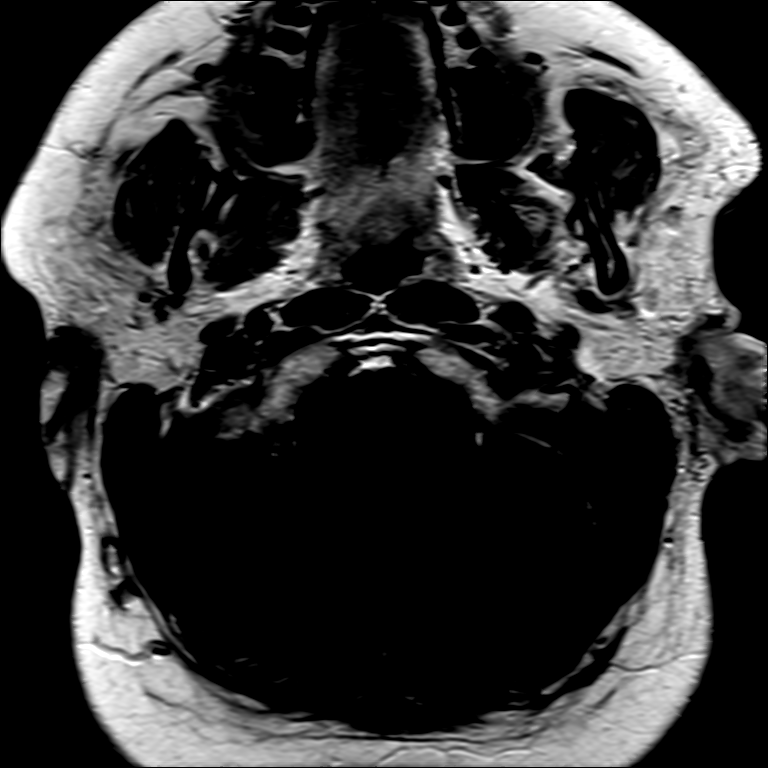

[Series 20: T1 post-contrast · coronal · 3.0mm · 0.21mm/px · 1 of 13 slices shown (2 of 3)]
[im 1/13]
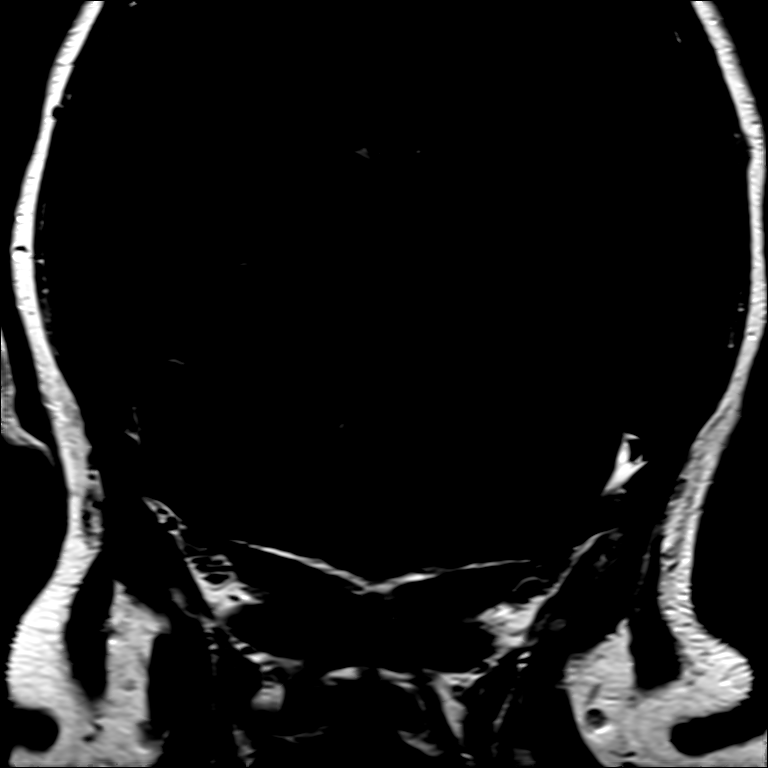

[Series 22: T1 post-contrast · coronal · 5.0mm · 0.34mm/px · 2 of 26 slices shown (3 of 3)]
[im 1/26]
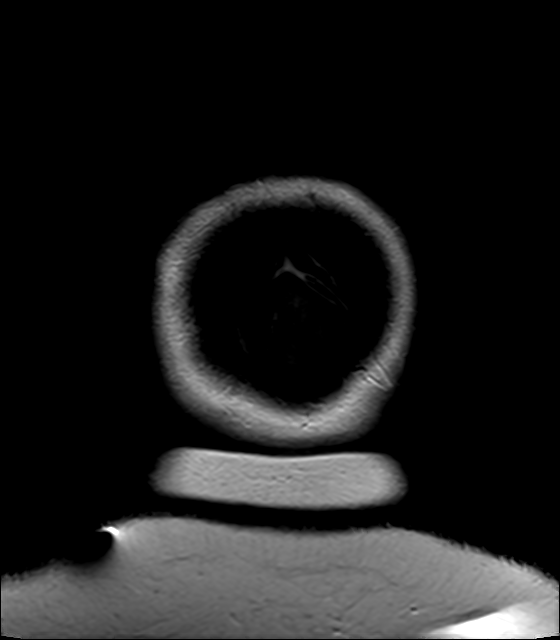
[im 26/26]
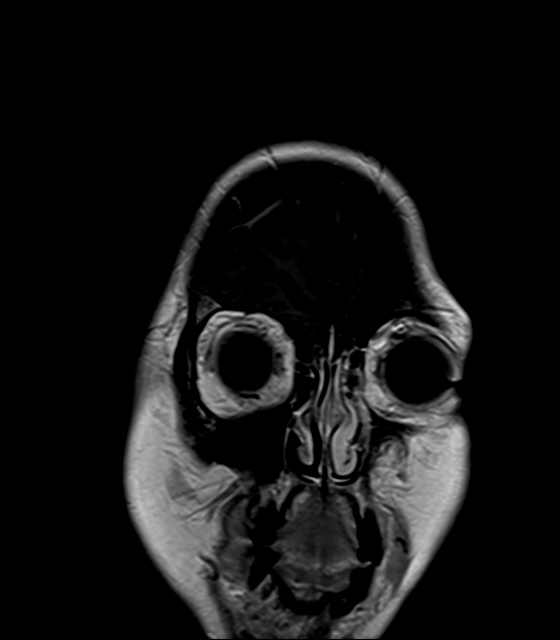

[29 of 48 positions shown; findings below may reference images not displayed]

FINDINGS: Brain: No restricted diffusion to suggest acute infarction. No
midline shift, mass effect, evidence of mass lesion,
ventriculomegaly, extra-axial collection or acute intracranial
hemorrhage. Cervicomedullary junction and pituitary are within
normal limits. Gray and white matter signal is within normal limits
for age throughout the brain. No abnormal intracranial enhancement
or dural thickening identified. Internal auditory details are below.

Vascular: Major intracranial vascular flow voids are preserved. The
major dural venous sinuses are enhancing and appear to be patent,
including the left sigmoid and IJ bulb.

Skull and upper cervical spine: Negative visible cervical spine.
Visualized bone marrow signal is within normal limits.

Sinuses/Orbits: Negative orbits. Paranasal sinuses are clear aside
from a left maxillary sinus retention cyst.

Other: Dedicated internal auditory imaging.

Normal cerebellopontine angles. Right side normal cisternal and
intracanalicular 7th and 8th cranial nerve segments. And there is
symmetric T2 signal in the bilateral cochlea and vestibular
structures. However, there is subtle asymmetric enhancement along
the course of the left intracanalicular 7th and/or 8th cranial
nerves (series 19, image 8) and conspicuous asymmetric enhancement
of the left 7th nerve anterior geniculate ganglion.

Trace bilateral mastoid air cell fluid, similar on both sides. No
abnormal enhancement on the right side. Bilateral stylomastoid
foramina are within normal limits, no parotid gland mass identified.
But there is an abnormal moderate to severe thickening and
enhancement of the left pinna and superficial external auditory
canal which demonstrates mildly abnormal diffusion. Left tympanic
cavity seems to remain clear.
IMPRESSION: 1. Abnormal enhancement of the left 7th nerve intracanalicular
segment and anterior geniculate ganglion superimposed on the
superficial left pinna and external auditory soft tissue swelling
and inflammation. Favor this is 7th Nerve Zoster (ARMAND
syndrome). If the patient is diabetic or immunocompromised also
consider a Pseudomonas external otitis.

2. Otherwise negative internal auditory imaging and normal for age
MRI appearance of the brain.

## 2021-03-30 MED ORDER — KETOROLAC TROMETHAMINE 15 MG/ML IJ SOLN
15.0000 mg | Freq: Once | INTRAMUSCULAR | Status: AC
  Administered 2021-03-30: 15 mg via INTRAVENOUS
  Filled 2021-03-30: qty 1

## 2021-03-30 MED ORDER — DIPHENHYDRAMINE HCL 25 MG PO CAPS
25.0000 mg | ORAL_CAPSULE | Freq: Every evening | ORAL | Status: DC | PRN
  Administered 2021-03-30: 25 mg via ORAL
  Filled 2021-03-30: qty 1

## 2021-03-30 MED ORDER — LISINOPRIL 10 MG PO TABS
20.0000 mg | ORAL_TABLET | Freq: Once | ORAL | Status: AC
  Administered 2021-03-30: 20 mg via ORAL
  Filled 2021-03-30: qty 2

## 2021-03-30 MED ORDER — ACYCLOVIR 800 MG PO TABS: 800.0000 mg | ORAL_TABLET | Freq: Every day | ORAL | 0 refills | Status: AC

## 2021-03-30 MED ORDER — FENTANYL CITRATE PF 50 MCG/ML IJ SOSY
50.0000 ug | PREFILLED_SYRINGE | Freq: Once | INTRAMUSCULAR | Status: AC
  Administered 2021-03-30: 50 ug via INTRAVENOUS
  Filled 2021-03-30: qty 1

## 2021-03-30 MED ORDER — AMLODIPINE BESYLATE 10 MG PO TABS: 10.0000 mg | ORAL_TABLET | Freq: Every day | ORAL | 0 refills | Status: AC

## 2021-03-30 MED ORDER — ACETAMINOPHEN 325 MG PO TABS
650.0000 mg | ORAL_TABLET | Freq: Once | ORAL | Status: AC
  Administered 2021-03-30: 650 mg via ORAL
  Filled 2021-03-30: qty 2

## 2021-03-30 MED ORDER — MORPHINE SULFATE (PF) 4 MG/ML IV SOLN
4.0000 mg | Freq: Once | INTRAVENOUS | Status: AC
  Administered 2021-03-30: 4 mg via INTRAVENOUS
  Filled 2021-03-30: qty 1

## 2021-03-30 MED ORDER — OFLOXACIN 0.3 % OT SOLN: 3.0000 [drp] | Freq: Two times a day (BID) | OTIC | 0 refills | Status: AC

## 2021-03-30 MED ORDER — SODIUM CHLORIDE 0.9 % IV BOLUS
1000.0000 mL | Freq: Once | INTRAVENOUS | Status: AC
  Administered 2021-03-30: 1000 mL via INTRAVENOUS

## 2021-03-30 MED ORDER — GADOBUTROL 1 MMOL/ML IV SOLN
10.0000 mL | Freq: Once | INTRAVENOUS | Status: AC | PRN
  Administered 2021-03-30: 10 mL via INTRAVENOUS

## 2021-03-30 MED ORDER — POLYVINYL ALCOHOL 1.4 % OP SOLN
1.0000 [drp] | OPHTHALMIC | Status: DC | PRN
  Filled 2021-03-30: qty 15

## 2021-03-30 NOTE — ED Notes (Signed)
Patient transported to MRI 

## 2021-03-30 NOTE — ED Provider Notes (Addendum)
Patient signed out to me is pending MRI imaging.  MRI concerning for abnormal enhancement of the seventh nerve on the left side concern for zoster versus Pseudomonas infection. ? ?Case discussed with neurology who is in agreement with current plan. ? ?Case discussed with infectious disease, recommending antibiotic eardrops and ciprofloxacin and valacyclovir we are on an outpatient basis. ? ?Patient will be recommended ENT follow-up within the week, advised immediate return for worsening symptoms or any additional concerns. ?  ?Cheryll Cockayne, MD ?03/30/21 1324 ? ?  ?Cheryll Cockayne, MD ?03/30/21 1324 ? ?

## 2021-03-30 NOTE — Progress Notes (Signed)
Inpatient Diabetes Program Recommendations ? ?AACE/ADA: New Consensus Statement on Inpatient Glycemic Control  ? ?Target Ranges:  Prepandial:   less than 140 mg/dL ?     Peak postprandial:   less than 180 mg/dL (1-2 hours) ?     Critically ill patients:  140 - 180 mg/dL  ? ? Latest Reference Range & Units 03/29/21 21:16  ?Glucose 70 - 99 mg/dL 240 (H)  ? ?Review of Glycemic Control ? ?Diabetes history: DM2 ?Outpatient Diabetes medications: Levemir 20 units daily, Metformin 1000 mg BID ?Current orders for Inpatient glycemic control: None; in ED ? ?Inpatient Diabetes Program Recommendations:   ? ?Insulin: While in the ED, please consider ordering CBGs AC&HS with Novolog 0-15 units TID with meals, Novolog 0-5 units QHS, and Levemir 10 units daily (to start this morning). ? ?HbgA1C: Please consider ordering an A1C to evaluate glycemic control over the past 2-3 months. ? ? ?NOTE: Patient in ED with facial swelling and was given Prednisone 60 mg at 23:28 on 03/29/21. Initial glucose 352 mg/dl on 09/7351 at 29:92. If patient remains in ED, will need to provide medication for DM control.  ? ?Thanks, ?Orlando Penner, RN, MSN, CDE ?Diabetes Coordinator ?Inpatient Diabetes Program ?[phone] (Team Pager from 8am to 5pm) ? ?

## 2022-04-05 ENCOUNTER — Other Ambulatory Visit: Payer: Self-pay

## 2022-04-05 ENCOUNTER — Emergency Department (HOSPITAL_COMMUNITY)
Admission: EM | Admit: 2022-04-05 | Discharge: 2022-04-05 | Disposition: A | Discharge status: Home / Self Care | Payer: Self-pay | Attending: Emergency Medicine | Admitting: Emergency Medicine

## 2022-04-05 ENCOUNTER — Emergency Department (HOSPITAL_COMMUNITY): Payer: Self-pay

## 2022-04-05 DIAGNOSIS — I10 Essential (primary) hypertension: Secondary | ICD-10-CM | POA: Insufficient documentation

## 2022-04-05 DIAGNOSIS — R55 Syncope and collapse: Secondary | ICD-10-CM | POA: Insufficient documentation

## 2022-04-05 DIAGNOSIS — F419 Anxiety disorder, unspecified: Secondary | ICD-10-CM | POA: Insufficient documentation

## 2022-04-05 DIAGNOSIS — E119 Type 2 diabetes mellitus without complications: Secondary | ICD-10-CM | POA: Insufficient documentation

## 2022-04-05 DIAGNOSIS — Z79899 Other long term (current) drug therapy: Secondary | ICD-10-CM | POA: Insufficient documentation

## 2022-04-05 LAB — D-DIMER, QUANTITATIVE: D-Dimer, Quant: 0.28 ug/mL-FEU (ref 0.00–0.50)

## 2022-04-05 LAB — BASIC METABOLIC PANEL
Anion gap: 14 (ref 5–15)
BUN: 17 mg/dL (ref 6–20)
CO2: 24 mmol/L (ref 22–32)
Calcium: 9.5 mg/dL (ref 8.9–10.3)
Chloride: 98 mmol/L (ref 98–111)
Creatinine, Ser: 0.63 mg/dL (ref 0.44–1.00)
GFR, Estimated: >60 mL/min (ref >60)
Glucose, Bld: 198 mg/dL — ABNORMAL HIGH (ref 70–99)
Potassium: 3.8 mmol/L (ref 3.5–5.1)
Sodium: 136 mmol/L (ref 135–145)

## 2022-04-05 LAB — TROPONIN I (HIGH SENSITIVITY)
Troponin I (High Sensitivity): 3 ng/L (ref <18)
Troponin I (High Sensitivity): 3 ng/L (ref <18)

## 2022-04-05 LAB — CBC WITH DIFFERENTIAL/PLATELET
Abs Immature Granulocytes: 0.05 10*3/uL (ref 0.00–0.07)
Basophils Absolute: 0 10*3/uL (ref 0.0–0.1)
Basophils Relative: 0 %
Eosinophils Absolute: 0 10*3/uL (ref 0.0–0.5)
Eosinophils Relative: 0 %
HCT: 40.1 % (ref 36.0–46.0)
Hemoglobin: 13.4 g/dL (ref 12.0–15.0)
Immature Granulocytes: 1 %
Lymphocytes Relative: 34 %
Lymphs Abs: 3.3 10*3/uL (ref 0.7–4.0)
MCH: 26.6 pg (ref 26.0–34.0)
MCHC: 33.4 g/dL (ref 30.0–36.0)
MCV: 79.7 fL — ABNORMAL LOW (ref 80.0–100.0)
Monocytes Absolute: 0.6 10*3/uL (ref 0.1–1.0)
Monocytes Relative: 7 %
Neutro Abs: 5.7 10*3/uL (ref 1.7–7.7)
Neutrophils Relative %: 58 %
Platelets: 273 10*3/uL (ref 150–400)
RBC: 5.03 MIL/uL (ref 3.87–5.11)
RDW: 13.7 % (ref 11.5–15.5)
WBC: 9.7 10*3/uL (ref 4.0–10.5)
nRBC: 0 % (ref 0.0–0.2)

## 2022-04-05 LAB — CBG MONITORING, ED: Glucose-Capillary: 226 mg/dL — ABNORMAL HIGH (ref 70–99)

## 2022-04-05 MED ORDER — LORAZEPAM 1 MG PO TABS
1.0000 mg | ORAL_TABLET | Freq: Once | ORAL | Status: AC
  Administered 2022-04-05: 1 mg via ORAL
  Filled 2022-04-05: qty 1

## 2022-04-05 MED ORDER — LORAZEPAM 2 MG PO TABS: 2.0000 mg | ORAL_TABLET | Freq: Four times a day (QID) | ORAL | 0 refills | Status: AC | PRN

## 2022-04-05 NOTE — Discharge Instructions (Addendum)
Please continue to work with your primary care provider on your withdrawal from venlafaxine (Effexor).  Be aware that if all of your symptoms are from venlafaxine withdrawal, they should get better within 2-3 weeks of completely stopping the medication.  Return to the emergency department if you have any new or concerning symptoms.

## 2022-04-05 NOTE — ED Triage Notes (Addendum)
Pt had a syncopal episode about an hour ago with LOC. States she hit right side of her head, denies taking blood thinners. Also c/o chest tightness. Pt tearful in triage, hx of anxiety. Currently trying to wean herself off her Effexor

## 2022-04-05 NOTE — ED Provider Notes (Addendum)
[facility] Provider Note   CSN: [id number] Arrival date & time: 04/05/22  0141     History  Chief Complaint  Patient presents with   Loss of Consciousness    Kathleen Hale is a 47 y.o. female.  The history is provided by the patient.  Loss of Consciousness She has history of hypertension, diabetes, hyperlipidemia, celiac disease and comes in because of anxiety and a syncopal episode.  She states that 3 weeks ago she decided that she wanted to taper off of Effexor.  She was taking 150 mg a day and dose has been decreased to where she is now on 37.5 mg/day.  Almost from the point that she decreased the dose, she started feeling anxious and jittery.  She has noted that her heart was racing.  She discussed with her provider who started her on Prozac to try to ameliorate the side effects of tapering the Effexor.  When symptoms did not improve, he increased the dose of the Prozac.  Tonight, she had an episode where she got very anxious and everything got black and she fell down.  She may have had brief loss of consciousness.  She has also been having some intermittent chest pain.  She denies nausea or diaphoresis but has had some dyspnea.  She is a non-smoker, but there is a family history of premature coronary atherosclerosis.   Home Medications Prior to Admission medications   Medication Sig Start Date End Date Taking? Authorizing Provider  amLODipine (NORVASC) 10 MG tablet Take 1 tablet (10 mg total) by mouth daily. 03/30/21 04/29/21  Luna Fuse, MD  esomeprazole (NEXIUM) 40 MG capsule Take 40 mg by mouth daily. 10/08/18   [provider]  LEVEMIR FLEXPEN 100 UNIT/ML FlexPen 20 Units 2 (two) times daily. 03/26/21   [provider]  lisinopril-hydrochlorothiazide (ZESTORETIC) 20-12.5 MG tablet Take 2 tablets by mouth daily. 03/16/21   [provider]  metFORMIN (GLUCOPHAGE) 1000 MG tablet Take 1,000 mg by mouth daily.  09/26/18   [provider]  rosuvastatin (CRESTOR) 20 MG tablet rosuvastatin 20 mg tablet  TAKE 1 TABLET BY MOUTH EVERY DAY AT BEDTIME 10/08/18   [provider]  venlafaxine XR (EFFEXOR-XR) 75 MG 24 hr capsule Take 75 mg by mouth daily with breakfast. 06/24/17   [provider]      Allergies    Penicillins and Buspirone    Review of Systems   Review of Systems  Cardiovascular:  Positive for syncope.  All other systems reviewed and are negative.   Physical Exam Updated Vital Signs BP 135/70   Pulse (!) 109   Temp 98.8 F (37.1 C) (Oral)   Resp 15   Ht 5\' 2"  (1.575 m)   Wt 104.3 kg   SpO2 96%   BMI 42.07 kg/m  Physical Exam Vitals and nursing note reviewed.   47 year old female, very anxious, but in no acute distress. Vital signs are significant for elevated heart rate. Oxygen saturation is 96%, which is normal. Head is normocephalic and atraumatic. PERRLA, EOMI. Oropharynx is clear. Neck is nontender and supple without adenopathy or JVD.  There are no carotid bruits. Back is nontender and there is no CVA tenderness. Lungs are clear without rales, wheezes, or rhonchi. Chest is nontender. Heart has regular rate and rhythm without murmur. Abdomen is soft, flat, nontender without masses or hepatosplenomegaly and peristalsis is normoactive. Extremities have no cyanosis or edema, full range of motion  is present. Skin is warm and dry without rash. Neurologic: Mental status is normal, cranial nerves are intact, moves all extremities equally.  ED Results / Procedures / Treatments   Labs (all labs ordered are listed, but only abnormal results are displayed) Labs Reviewed  BASIC METABOLIC PANEL - Abnormal; Notable for the following components:      Result Value   Glucose, Bld 198 (*)    All other components within normal limits  CBC WITH DIFFERENTIAL/PLATELET - Abnormal; Notable for the following components:   MCV 79.7 (*)    All other components  within normal limits  CBG MONITORING, ED - Abnormal; Notable for the following components:   Glucose-Capillary 226 (*)    All other components within normal limits  D-DIMER, QUANTITATIVE  TROPONIN I (HIGH SENSITIVITY)  TROPONIN I (HIGH SENSITIVITY)    EKG EKG Interpretation  Date/Time:  Monday April 05 2022 01:59:03 EDT Ventricular Rate:  120 PR Interval:  150 QRS Duration: 83 QT Interval:  331 QTC Calculation: 468 R Axis:   -8 Text Interpretation: Sinus tachycardia Atrial premature complex Probable inferior infarct, old No old tracing to compare Confirmed by Delora Fuel ([id number]) on 04/05/2022 2:11:06 AM  Radiology DG Chest Port 1 View  Result Date: 04/05/2022 CLINICAL DATA:  47 year old female with syncope, fall, chest tightness, shortness of breath, headache. EXAM: PORTABLE CHEST 1 VIEW COMPARISON:  Neck CT 03/29/2021. FINDINGS: Portable AP upright view at 0358 hours. Lordotic positioning. Normal lung volumes and mediastinal contours. Visualized tracheal air column is within normal limits. Allowing for portable technique the lungs are clear. No pneumothorax or pleural effusion. No acute osseous abnormality identified. Negative visible bowel gas. IMPRESSION: Negative portable chest. Electronically Signed   By: Genevie Ann M.D.   On: 04/05/2022 04:18    Procedures Procedures  Cardiac monitor shows sinus tachycardia, per my interpretation.  Medications Ordered in ED Medications  LORazepam (ATIVAN) tablet 1 mg (1 mg Oral Given 04/05/22 0408)  LORazepam (ATIVAN) tablet 1 mg (1 mg Oral Given 04/05/22 [id number])    ED Course/ Medical Decision Making/ A&P                             Medical Decision Making Amount and/or Complexity of Data Reviewed Labs: ordered. Radiology: ordered.  Risk Prescription drug management.   Anxiety which seems most likely secondary to withdrawal from venlafaxine.  Syncopal episode which may have been hyperventilation.  However, with tachycardia, I cannot  rule out pulmonary embolism.  I reviewed and interpreted her electrocardiogram, and my interpretation is sinus tachycardia with PAC but no ST or T changes.  I have ordered chest x-ray, CBC, basic metabolic panel, troponin x 2, D-dimer to rule out pulmonary embolism.  She did not have much improvement with a dose of lorazepam, and I repeated the dose of lorazepam.  With the second dose of lorazepam, she is feeling much better.  She seems much calmer.  I have reviewed and interpreted her laboratory tests and my interpretation is normal basic metabolic panel, normal CBC, normal initial troponin, normal D-dimer.  There is no evidence of pulmonary embolism, repeat troponin is pending.  Chest x-ray shows no acute cardiopulmonary process.  I have independently viewed the image, and agree with the radiologist's interpretation.  If repeat troponin is normal, she can safely be discharged.  I am planning to give her a prescription for a 3-day supply of lorazepam to try to get her over the  difficult part of her venlafaxine withdrawal, but I have stressed the importance of working with her medication prescriber to make appropriate adjustments.    Repeat troponin is normal.  I am discharging her with instructions to follow-up with her primary care provider and with the above-noted prescription.  Final Clinical Impression(s) / ED Diagnoses Final diagnoses:  Anxiety  Near syncope    Rx / DC Orders ED Discharge Orders          Ordered    LORazepam (ATIVAN) 2 MG tablet  Every 6 hours PRN        04/05/22 0000000              Delora Fuel, MD [id number]    Delora Fuel, MD [id number] [phone]

## 2023-04-24 ENCOUNTER — Encounter (HOSPITAL_COMMUNITY): Payer: Self-pay | Admitting: *Deleted

## 2023-04-24 ENCOUNTER — Emergency Department (HOSPITAL_COMMUNITY)
Admission: EM | Admit: 2023-04-24 | Discharge: 2023-04-24 | Disposition: A | Discharge status: Home / Self Care | Attending: Emergency Medicine | Admitting: Emergency Medicine

## 2023-04-24 ENCOUNTER — Other Ambulatory Visit: Payer: Self-pay

## 2023-04-24 ENCOUNTER — Emergency Department (HOSPITAL_COMMUNITY)

## 2023-04-24 DIAGNOSIS — M7981 Nontraumatic hematoma of soft tissue: Secondary | ICD-10-CM | POA: Insufficient documentation

## 2023-04-24 DIAGNOSIS — D649 Anemia, unspecified: Secondary | ICD-10-CM | POA: Diagnosis not present

## 2023-04-24 DIAGNOSIS — R739 Hyperglycemia, unspecified: Secondary | ICD-10-CM | POA: Insufficient documentation

## 2023-04-24 DIAGNOSIS — R110 Nausea: Secondary | ICD-10-CM | POA: Insufficient documentation

## 2023-04-24 DIAGNOSIS — R1013 Epigastric pain: Secondary | ICD-10-CM | POA: Diagnosis not present

## 2023-04-24 DIAGNOSIS — M5441 Lumbago with sciatica, right side: Secondary | ICD-10-CM | POA: Diagnosis not present

## 2023-04-24 DIAGNOSIS — M546 Pain in thoracic spine: Secondary | ICD-10-CM | POA: Insufficient documentation

## 2023-04-24 DIAGNOSIS — R58 Hemorrhage, not elsewhere classified: Secondary | ICD-10-CM

## 2023-04-24 DIAGNOSIS — M545 Low back pain, unspecified: Secondary | ICD-10-CM | POA: Diagnosis present

## 2023-04-24 HISTORY — DX: Pure hypercholesterolemia, unspecified: E78.00

## 2023-04-24 LAB — CBC WITH DIFFERENTIAL/PLATELET
Abs Immature Granulocytes: 0.02 10*3/uL (ref 0.00–0.07)
Basophils Absolute: 0 10*3/uL (ref 0.0–0.1)
Basophils Relative: 1 %
Eosinophils Absolute: 0.1 10*3/uL (ref 0.0–0.5)
Eosinophils Relative: 2 %
HCT: 35.7 % — ABNORMAL LOW (ref 36.0–46.0)
Hemoglobin: 11.2 g/dL — ABNORMAL LOW (ref 12.0–15.0)
Immature Granulocytes: 0 %
Lymphocytes Relative: 40 %
Lymphs Abs: 2.5 10*3/uL (ref 0.7–4.0)
MCH: 23.6 pg — ABNORMAL LOW (ref 26.0–34.0)
MCHC: 31.4 g/dL (ref 30.0–36.0)
MCV: 75.2 fL — ABNORMAL LOW (ref 80.0–100.0)
Monocytes Absolute: 0.4 10*3/uL (ref 0.1–1.0)
Monocytes Relative: 6 %
Neutro Abs: 3.2 10*3/uL (ref 1.7–7.7)
Neutrophils Relative %: 51 %
Platelets: 233 10*3/uL (ref 150–400)
RBC: 4.75 MIL/uL (ref 3.87–5.11)
RDW: 14.8 % (ref 11.5–15.5)
WBC: 6.2 10*3/uL (ref 4.0–10.5)
nRBC: 0 % (ref 0.0–0.2)

## 2023-04-24 LAB — COMPREHENSIVE METABOLIC PANEL WITH GFR
ALT: 20 U/L (ref 0–44)
AST: 22 U/L (ref 15–41)
Albumin: 3.3 g/dL — ABNORMAL LOW (ref 3.5–5.0)
Alkaline Phosphatase: 74 U/L (ref 38–126)
Anion gap: 10 (ref 5–15)
BUN: 16 mg/dL (ref 6–20)
CO2: 27 mmol/L (ref 22–32)
Calcium: 9.2 mg/dL (ref 8.9–10.3)
Chloride: 101 mmol/L (ref 98–111)
Creatinine, Ser: 0.65 mg/dL (ref 0.44–1.00)
GFR, Estimated: >60 mL/min (ref >60)
Glucose, Bld: 221 mg/dL — ABNORMAL HIGH (ref 70–99)
Potassium: 3.4 mmol/L — ABNORMAL LOW (ref 3.5–5.1)
Sodium: 138 mmol/L (ref 135–145)
Total Bilirubin: <0.2 mg/dL (ref 0.0–1.2)
Total Protein: 6.4 g/dL — ABNORMAL LOW (ref 6.5–8.1)

## 2023-04-24 LAB — URINALYSIS, ROUTINE W REFLEX MICROSCOPIC
Bacteria, UA: NONE SEEN
Bilirubin Urine: NEGATIVE
Glucose, UA: >=500 mg/dL — AB
Hgb urine dipstick: NEGATIVE
Ketones, ur: 5 mg/dL — AB
Leukocytes,Ua: NEGATIVE
Nitrite: NEGATIVE
Protein, ur: NEGATIVE mg/dL
Specific Gravity, Urine: 1.031 — ABNORMAL HIGH (ref 1.005–1.030)
pH: 5 (ref 5.0–8.0)

## 2023-04-24 LAB — TROPONIN I (HIGH SENSITIVITY): Troponin I (High Sensitivity): <2 ng/L (ref <18)

## 2023-04-24 LAB — LIPASE, BLOOD: Lipase: 41 U/L (ref 11–51)

## 2023-04-24 LAB — PROTIME-INR
INR: 1 (ref 0.8–1.2)
Prothrombin Time: 13.1 s (ref 11.4–15.2)

## 2023-04-24 MED ORDER — IOHEXOL 300 MG/ML  SOLN
100.0000 mL | Freq: Once | INTRAMUSCULAR | Status: AC | PRN
  Administered 2023-04-24: 100 mL via INTRAVENOUS

## 2023-04-24 MED ORDER — ONDANSETRON HCL 4 MG/2ML IJ SOLN
4.0000 mg | Freq: Once | INTRAMUSCULAR | Status: AC
  Administered 2023-04-24: 4 mg via INTRAVENOUS
  Filled 2023-04-24: qty 2

## 2023-04-24 MED ORDER — METHOCARBAMOL 500 MG PO TABS: 500.0000 mg | ORAL_TABLET | Freq: Two times a day (BID) | ORAL | 0 refills | Status: DC

## 2023-04-24 MED ORDER — MORPHINE SULFATE (PF) 4 MG/ML IV SOLN
4.0000 mg | Freq: Once | INTRAVENOUS | Status: AC
  Administered 2023-04-24: 4 mg via INTRAVENOUS
  Filled 2023-04-24: qty 1

## 2023-04-24 NOTE — ED Triage Notes (Signed)
 Pt noted some bruising to abdomen today, pt denies any injury.  Pt mostly lower back pain but pain to upper back, ongoing for months per pt. Past few days have been worse.

## 2023-04-24 NOTE — Discharge Instructions (Signed)
 These follow-up closely with your primary care doctor on an outpatient basis.  Return to emergency department immediately for any new or worsening symptoms.

## 2023-04-24 NOTE — ED Provider Notes (Signed)
 [place] EMERGENCY DEPARTMENT AT [facility] Provider Note   CSN: [id number] Arrival date & time: 04/24/23  1734     History  Chief Complaint  Patient presents with   Back Pain    Kathleen Hale is a 48 y.o. female.  Patient is a 48 year old female who presents to the emergency department with a chief complaint of epigastric abdominal pain, upper back pain and an area of bruising along the right side of her abdomen.  She notes that the abdominal pain and back pain has been ongoing for weeks.  She notes that she did develop an area of bruising along the right side of the abdomen today.  She denies any recent falls or blunt trauma to the affected area.  She has had some associated nausea without vomiting.  She denies any constipation or diarrhea.  There is been no dysuria or hematuria.  She denies any dizziness, lightheadedness or syncope.   Back Pain Associated symptoms: abdominal pain        Home Medications Prior to Admission medications   Medication Sig Start Date End Date Taking? Authorizing Provider  amLODipine (NORVASC) 10 MG tablet Take 1 tablet (10 mg total) by mouth daily. 03/30/21 04/29/21  Cheryll Cockayne, MD  esomeprazole (NEXIUM) 40 MG capsule Take 40 mg by mouth daily. 10/08/18   [provider]  FLUoxetine (PROZAC) 10 MG capsule Take 10 mg by mouth daily.    [provider]  gabapentin (NEURONTIN) 100 MG capsule Take 2 capsules by mouth 2 (two) times daily.    [provider]  HUMALOG KWIKPEN 100 UNIT/ML KwikPen Inject 15 Units into the skin daily.    [provider]  hydrOXYzine (ATARAX) 25 MG tablet Take 2-4 tablets by mouth at bedtime.    [provider]  LEVEMIR FLEXPEN 100 UNIT/ML FlexPen 20 Units 2 (two) times daily. 03/26/21   [provider]  lisinopril-hydrochlorothiazide (ZESTORETIC) 20-12.5 MG tablet Take 2 tablets by mouth daily. 03/16/21   [provider]  LORazepam (ATIVAN) 2 MG tablet  Take 1 tablet (2 mg total) by mouth every 6 (six) hours as needed for anxiety. 04/05/22   Dione Booze, MD  metFORMIN (GLUCOPHAGE) 1000 MG tablet Take 1,000 mg by mouth daily. 09/26/18   [provider]  rosuvastatin (CRESTOR) 20 MG tablet rosuvastatin 20 mg tablet  TAKE 1 TABLET BY MOUTH EVERY DAY AT BEDTIME 10/08/18   [provider]  venlafaxine XR (EFFEXOR-XR) 37.5 MG 24 hr capsule TAKE AS DIRECTED, cross-taper plan WEEKS 1-2 TAKE prozac 10mg  AND effexor 37.5mg  DAILY, WEEKS 3 TO 4 TAKE prozac 10mg  DAILY AND effexor 37.5mg  EVERY OTHER DAY, WEEK 5 TAKE prozac 10mg  DAILY AND STOP effexor 37.5mg , WEEK 6 continue prozac 10mg  DAILY UNTIL OUT OF prescription    [provider]      Allergies    Penicillins and Buspirone    Review of Systems   Review of Systems  Gastrointestinal:  Positive for abdominal pain.  Musculoskeletal:  Positive for back pain.  Skin:        Ecchymosis  All other systems reviewed and are negative.   Physical Exam Updated Vital Signs BP (!) 155/74 (BP Location: Right Arm)   Pulse 88   Temp 98.7 F (37.1 C) (Oral)   Resp 20   Ht 5\' 2"  (1.575 m)   Wt 99.8 kg   SpO2 98%   BMI 40.24 kg/m  Physical Exam Vitals and nursing note reviewed.  Constitutional:  Appearance: Normal appearance.  HENT:     Head: Normocephalic and atraumatic.     Nose: Nose normal.     Mouth/Throat:     Mouth: Mucous membranes are moist.  Eyes:     Extraocular Movements: Extraocular movements intact.     Conjunctiva/sclera: Conjunctivae normal.     Pupils: Pupils are equal, round, and reactive to light.  Cardiovascular:     Rate and Rhythm: Normal rate and regular rhythm.     Pulses: Normal pulses.     Heart sounds: Normal heart sounds. No murmur heard.    No gallop.  Pulmonary:     Effort: Pulmonary effort is normal. No respiratory distress.     Breath sounds: Normal breath sounds. No stridor. No wheezing, rhonchi or rales.  Abdominal:     General:  Abdomen is flat. Bowel sounds are normal. There is no distension.     Palpations: Abdomen is soft.     Tenderness: There is no guarding.     Comments: Palpation to epigastric region, area of ecchymosis of the right side of the abdomen  Musculoskeletal:        General: Normal range of motion.     Cervical back: Normal range of motion and neck supple.  Skin:    General: Skin is warm and dry.  Neurological:     General: No focal deficit present.     Mental Status: She is alert and oriented to person, place, and time. Mental status is at baseline.  Psychiatric:        Mood and Affect: Mood normal.        Behavior: Behavior normal.        Thought Content: Thought content normal.        Judgment: Judgment normal.     ED Results / Procedures / Treatments   Labs (all labs ordered are listed, but only abnormal results are displayed) Labs Reviewed  CBC WITH DIFFERENTIAL/PLATELET - Abnormal; Notable for the following components:      Result Value   Hemoglobin 11.2 (*)    HCT 35.7 (*)    MCV 75.2 (*)    MCH 23.6 (*)    All other components within normal limits  URINALYSIS, ROUTINE W REFLEX MICROSCOPIC - Abnormal; Notable for the following components:   Color, Urine STRAW (*)    Specific Gravity, Urine 1.031 (*)    Glucose, UA >=500 (*)    Ketones, ur 5 (*)    All other components within normal limits  COMPREHENSIVE METABOLIC PANEL WITH GFR  LIPASE, BLOOD  PROTIME-INR  TROPONIN I (HIGH SENSITIVITY)    EKG None  Radiology No results found.  Procedures Procedures    Medications Ordered in ED Medications  morphine (PF) 4 MG/ML injection 4 mg (has no administration in time range)  ondansetron (ZOFRAN) injection 4 mg (has no administration in time range)    ED Course/ Medical Decision Making/ A&P                                 Medical Decision Making Amount and/or Complexity of Data Reviewed Labs: ordered. Radiology: ordered.  Risk Prescription drug  management.   This patient presents to the ED for concern of abdominal pain, back pain, bruising to right side of abdomen differential diagnosis includes acute appendicitis, cholecystitis, bowel obstruction, diverticulitis, ovarian torsion or cyst, pyelonephritis, kidney stone, pancreatitis, mesenteric ischemia, cauda equina syndrome, vertebral mellitus, epidural abscess,  muscle strain, sciatica    Additional history obtained:  Additional history obtained from husband External records from outside source obtained and reviewed including none   Lab Tests:  I Ordered, and personally interpreted labs.  The pertinent results include: Mild anemia, hyperglycemia, normal kidney function liver function, normal electrolytes, negative troponin, negative lipase   Imaging Studies ordered:  I ordered imaging studies including CT scan of the abdomen and pelvis I independently visualized and interpreted imaging which showed small hiatal hernia, no acute intra-abdominal surgical process I agree with the radiologist interpretation   Medicines ordered and prescription drug management:  I ordered medication including Robaxin for low back pain Reevaluation of the patient after these medicines showed that the patient improved I have reviewed the patients home medicines and have made adjustments as needed   Problem List / ED Course:  Patient is doing well at this time and is stable for discharge home.  Discussed with patient that workup in the emergency department has been overall unremarkable.  She was made aware of the new anemia and was directed to follow-up closely with her primary care doctor for continued monitoring of this.  Patient had no other acute changes on blood work.  CT scan of abdomen and pelvis demonstrated no acute surgical process.  She has no clinical indication for cauda equina syndrome, vertebral osteomyelitis, epidural abscess.  Is unclear the source of the ecchymosis on the right  side of her abdomen.  Discussed the importance of continued close follow-up with her primary care doctor on an outpatient basis.  Patient voiced understanding to the plan and had no additional questions. Patient was evaluated by attending physician who is in agreement to plan at this time.    Social Determinants of Health:  None           Final Clinical Impression(s) / ED Diagnoses Final diagnoses:  None    Rx / DC Orders ED Discharge Orders     None         Emmalene Hare 04/24/23 2019    Cheyenne Cotta, MD 04/25/23 1008

## 2023-07-25 ENCOUNTER — Other Ambulatory Visit (INDEPENDENT_AMBULATORY_CARE_PROVIDER_SITE_OTHER): Payer: Self-pay

## 2023-07-25 ENCOUNTER — Ambulatory Visit (INDEPENDENT_AMBULATORY_CARE_PROVIDER_SITE_OTHER): Admitting: Orthopedic Surgery

## 2023-07-25 VITALS — BP 133/85 | HR 111 | Ht 62.0 in | Wt 220.0 lb

## 2023-07-25 DIAGNOSIS — M545 Low back pain, unspecified: Secondary | ICD-10-CM | POA: Diagnosis not present

## 2023-07-25 NOTE — Progress Notes (Signed)
 Orthopedic Spine Surgery Office Note  Assessment: Patient is a 48 y.o. female with low back pain that radiates into the right lower extremity along the posterior aspect of the thigh and leg.  Suspect radiculopathy   Plan: -Explained that initially conservative treatment is tried as a significant number of patients may experience relief with these treatment modalities. Discussed that the conservative treatments include:  -activity modification  -physical therapy  -over the counter pain medications  -medrol dosepak  -lumbar steroid injections -Patient has tried tylenol , aleve, methocarbamol , gabapentin  -Recommended PT.  Referral provided to her today -Would need an updated A1c that is less than 7.5 prior to any elective spine surgery -If she is not doing any better at her next visit, will order MRI of the lumbar spine to evaluate for radiculopathy -Patient should return to office in 7-8 weeks, x-rays at next visit: none   Patient expressed understanding of the plan and all questions were answered to the patient's satisfaction.   ___________________________________________________________________________   History:  Patient is a 48 y.o. female who presents today for lumbar spine.  Patient has had about a year of low back pain that radiates into the right lower extremity.  She feels a going along the posterior aspect of the thigh and leg to the level of the foot.  There is no trauma or injury that preceded the onset of the pain.  She feels the pain more with activity but notes it also with rest.  She does not have any pain radiating into the left lower extremity.  The majority of her pain is in the low back.  She said the worst area is in the lower lumbar region and radiating into the immediately Jasen paraspinal muscles.  She feels that pain has gotten progressively worse with time.   Weakness: Yes, has some left hand residual weakness from her stroke.  No other weakness Symptoms of  imbalance: Denies Paresthesias and numbness: Yes, has numbness and paresthesias in her feet from neuropathy.  No recent changes. Bowel or bladder incontinence: Denies Saddle anesthesia: Denies  Treatments tried: tylenol , aleve, methocarbamol , gabapentin  Review of systems: Denies fevers and chills, night sweats, unexplained weight loss, history of cancer.  Has had pain that wakes her at night  Past medical history: HTN HLD GERD History of stroke History of cervical cancer Depression/anxiety OSA DM Neuropathy  Allergies: penicillin, buspirone  Past surgical history:  Cesarean section Hysterectomy  Social history: Denies use of nicotine product (smoking, vaping, patches, smokeless) Alcohol  use: denies Denies recreational drug use   Physical Exam:  BMI of 40.2  General: no acute distress, appears stated age Neurologic: alert, answering questions appropriately, following commands Respiratory: unlabored breathing on room air, symmetric chest rise Psychiatric: appropriate affect, normal cadence to speech   MSK (spine):  -Strength exam      Left  Right EHL    5/5  5/5 TA    5/5  5/5 GSC    5/5  5/5 Knee extension  5/5  5/5 Hip flexion   5/5  5/5  -Sensory exam    Sensation intact to light touch in L3-S1 nerve distributions of bilateral lower extremities  -Achilles DTR: 1/4 on the left, 1/4 on the right -Patellar tendon DTR: 1/4 on the left, 1/4 on the right  -Straight leg raise: Negative bilaterally -Clonus: no beats bilaterally  -Left hip exam: No pain through range of motion, negative Stinchfield, negative FABER -Right hip exam: No pain through range of motion, negative Stinchfield,  negative FABER  Imaging: XRs of the lumbar spine from 07/25/2023 were independently reviewed and interpreted, showing no significant degenerative changes.  No evidence of instability on flexion/test views.  No fracture or dislocation seen.   Patient name: Kathleen Hale Patient MRN: [id number] Date of visit: 07/25/23

## 2023-09-14 ENCOUNTER — Ambulatory Visit (INDEPENDENT_AMBULATORY_CARE_PROVIDER_SITE_OTHER): Admitting: Orthopedic Surgery

## 2023-09-14 DIAGNOSIS — M5416 Radiculopathy, lumbar region: Secondary | ICD-10-CM | POA: Diagnosis not present

## 2023-09-14 MED ORDER — TRAMADOL HCL 50 MG PO TABS: 50.0000 mg | ORAL_TABLET | Freq: Four times a day (QID) | ORAL | 0 refills | Status: AC | PRN

## 2023-09-14 NOTE — Progress Notes (Signed)
 Orthopedic Spine Surgery Office Note   Assessment: Patient is a 48 y.o. female with low back pain that radiates into the right lower extremity along the posterior aspect of the thigh and leg.  Suspect radiculopathy     Plan: -Patient has tried PT, tylenol , aleve, methocarbamol , gabapentin  -Patient has tried multiple conservative treatments including PT and medications without relief, so recommend an MRI of the lumbar spine to evaluate for radiculopathy -Told her to bring a CD copy of the MRI since she is getting it at [facility] -Would need an updated A1c that is less than 7.5 prior to any elective spine surgery -Patient should return to office in 4 weeks, x-rays at next visit: none     Patient expressed understanding of the plan and all questions were answered to the patient's satisfaction.    ___________________________________________________________________________     History:   Patient is a 48 y.o. female who presents today for follow up on her lumbar spine.  Patient continues to have significant low back pain that radiates into the right lower extremity.  She feels the going along the posterior aspect of the thigh and leg.  Her back pain is more significant than her leg pain.  After her last visit, she did physical therapy.  She felt the physical therapy was making her pain worse.  The physical therapist also noted that it was not helping and discharged her as it was not providing any benefit.  She has not developed any new symptoms since she was last seen in the office.   Treatments tried: PT, tylenol , aleve, methocarbamol , gabapentin     Physical Exam:   General: no acute distress, appears stated age Neurologic: alert, answering questions appropriately, following commands Respiratory: unlabored breathing on room air, symmetric chest rise Psychiatric: appropriate affect, normal cadence to speech     MSK (spine):   -Strength exam                                                    Left                  Right EHL                              5/5                  5/5 TA                                 5/5                  5/5 GSC                             5/5                  5/5 Knee extension            5/5                  5/5 Hip flexion                    5/5  5/5   -Sensory exam                           Sensation intact to light touch in L3-S1 nerve distributions of bilateral lower extremities   Imaging: XRs of the lumbar spine from 07/25/2023 were previously independently reviewed and interpreted, showing no significant degenerative changes.  No evidence of instability on flexion/test views.  No fracture or dislocation seen.     Patient name: Kathleen Hale Patient MRN: [id number] Date of visit: 09/14/23

## 2023-09-22 ENCOUNTER — Telehealth: Payer: Self-pay | Admitting: Orthopedic Surgery

## 2023-10-17 ENCOUNTER — Ambulatory Visit (INDEPENDENT_AMBULATORY_CARE_PROVIDER_SITE_OTHER): Admitting: Orthopedic Surgery

## 2023-10-17 DIAGNOSIS — M47816 Spondylosis without myelopathy or radiculopathy, lumbar region: Secondary | ICD-10-CM

## 2023-10-17 MED ORDER — TRAMADOL HCL 50 MG PO TABS: 50.0000 mg | ORAL_TABLET | Freq: Four times a day (QID) | ORAL | 0 refills | Status: AC | PRN

## 2023-10-17 NOTE — Progress Notes (Signed)
 Orthopedic Spine Surgery Office Note   Assessment: Patient is a 48 y.o. female with low back pain that radiates into the right lower extremity along the posterior aspect of the thigh and leg.  Possible radiculopathy     Plan: -Patient has tried PT, tylenol , aleve, methocarbamol , gabapentin  - Recommended a diagnostic/therapeutic L5 transforaminal injection.  Referral provided to her today -Would need an updated A1c that is less than 7.5 prior to any elective spine surgery -Patient should return to office on an as-needed basis     Patient expressed understanding of the plan and all questions were answered to the patient's satisfaction.    ___________________________________________________________________________     History:   Patient is a 48 y.o. female who presents today for follow up on her lumbar spine.  Patient is still having significant low back pain that radiates of the right lower extremity.  She feels going along the right buttock and posterior thigh.  I will often go down the posterior leg as well.  No pain radiating into the left lower extremity.  Back pain is still worse than the leg pain.  Has not developed any new symptoms since she was last seen in the office.  Pain is rated as an 8 out of 10 at its worst.   Treatments tried: PT, tylenol , aleve, methocarbamol , gabapentin     Physical Exam:   General: no acute distress, appears stated age Neurologic: alert, answering questions appropriately, following commands Respiratory: unlabored breathing on room air, symmetric chest rise Psychiatric: appropriate affect, normal cadence to speech     MSK (spine):   -Strength exam                                                   Left                  Right EHL                              5/5                  5/5 TA                                 5/5                  5/5 GSC                             5/5                  5/5 Knee extension            5/5                   5/5 Hip flexion                    5/5                  5/5   -Sensory exam                           Sensation intact  to light touch in L3-S1 nerve distributions of bilateral lower extremities   Imaging: XRs of the lumbar spine from 07/25/2023 were previously independently reviewed and interpreted, showing no significant degenerative changes.  No evidence of instability on flexion/extension views.  No fracture or dislocation seen.   MRI of the lumbar spine (on CD) from 09/23/2023 was independently reviewed and interpreted, showing a small foraminal disc ration at L5/S1 on the right.  No other significant stenosis or neural compression seen.  No significant DDD.  Patient name: Kathleen Hale Patient MRN: [id number] Date of visit: 10/17/23

## 2023-10-20 ENCOUNTER — Telehealth: Payer: Self-pay

## 2023-10-20 ENCOUNTER — Other Ambulatory Visit: Payer: Self-pay | Admitting: Physical Medicine and Rehabilitation

## 2023-10-20 MED ORDER — DIAZEPAM 5 MG PO TABS: ORAL_TABLET | ORAL | 0 refills | Status: AC

## 2023-10-20 NOTE — Telephone Encounter (Signed)
 Patient asking for pre procedural Valium

## 2023-11-14 ENCOUNTER — Other Ambulatory Visit: Payer: Self-pay

## 2023-11-14 ENCOUNTER — Encounter: Payer: Self-pay | Admitting: Radiology

## 2023-11-14 ENCOUNTER — Ambulatory Visit (INDEPENDENT_AMBULATORY_CARE_PROVIDER_SITE_OTHER): Admitting: Physical Medicine and Rehabilitation

## 2023-11-14 VITALS — BP 117/71 | HR 80

## 2023-11-14 DIAGNOSIS — M5416 Radiculopathy, lumbar region: Secondary | ICD-10-CM | POA: Diagnosis not present

## 2023-11-14 MED ORDER — METHYLPREDNISOLONE ACETATE 40 MG/ML IJ SUSP
40.0000 mg | Freq: Once | INTRAMUSCULAR | Status: AC
  Administered 2023-11-14: 40 mg

## 2023-11-14 NOTE — Progress Notes (Unsigned)
 Pain Scale   Average Pain 9 Patient  advising she has chronic lower back pain radiating to right leg patient advising walking and standing increase her pain and sitting and and resting decreasing         +Driver, -BT, -Dye Allergies.

## 2023-11-15 NOTE — Progress Notes (Signed)
   Kathleen Hale - 48 y.o. female MRN [id number]  Date of birth: 1975/12/23  Office Visit Note: Visit Date: 11/14/2023 PCP: Harvey Lynwood DASEN, MD Referred by: Georgina Ozell LABOR, MD  Subjective: Chief Complaint  Patient presents with   Lower Back - Pain   HPI:  Kathleen Hale is a 48 y.o. female who comes in today at the request of Dr. Ozell Georgina for planned Right L5-S1 Lumbar Transforaminal epidural steroid injection with fluoroscopic guidance.  The patient has failed conservative care including home exercise, medications, time and activity modification.  This injection will be diagnostic and hopefully therapeutic.  Please see requesting physician notes for further details and justification.   ROS Otherwise per HPI.  Assessment & Plan: Visit Diagnoses:    ICD-10-CM   1. Lumbar radiculopathy  M54.16 XR C-ARM NO REPORT    Epidural Steroid injection    methylPREDNISolone acetate (DEPO-MEDROL) injection 40 mg      Plan: No additional findings.   Meds & Orders:  Meds ordered this encounter  Medications   methylPREDNISolone acetate (DEPO-MEDROL) injection 40 mg    Orders Placed This Encounter  Procedures   XR C-ARM NO REPORT   Epidural Steroid injection    Follow-up: No follow-ups on file.   Procedures: No procedures performed      Clinical History: MRI of the lumbar spine (on CD) from 09/23/2023 was independently reviewed and interpreted, showing a small foraminal disc ration at L5/S1 on the right.  No other significant stenosis or neural compression seen.  No significant DDD.     Objective:  VS:  HT:    WT:   BMI:     BP:117/71  HR:80bpm  TEMP: ( )  RESP:  Physical Exam Vitals and nursing note reviewed.  Constitutional:      General: She is not in acute distress.    Appearance: Normal appearance. She is obese. She is not ill-appearing.  HENT:     Head: Normocephalic and atraumatic.     Right Ear: External ear normal.     Left Ear: External ear normal.  Eyes:      Extraocular Movements: Extraocular movements intact.  Cardiovascular:     Rate and Rhythm: Normal rate.     Pulses: Normal pulses.  Pulmonary:     Effort: Pulmonary effort is normal. No respiratory distress.  Abdominal:     General: There is no distension.     Palpations: Abdomen is soft.  Musculoskeletal:        General: Tenderness present.     Cervical back: Neck supple.     Right lower leg: No edema.     Left lower leg: No edema.     Comments: Patient has good distal strength with no pain over the greater trochanters.  No clonus or focal weakness.  Skin:    Findings: No erythema, lesion or rash.  Neurological:     General: No focal deficit present.     Mental Status: She is alert and oriented to person, place, and time.     Sensory: No sensory deficit.     Motor: No weakness or abnormal muscle tone.     Coordination: Coordination normal.  Psychiatric:        Mood and Affect: Mood normal.        Behavior: Behavior normal.      Imaging: XR C-ARM NO REPORT Result Date: 11/14/2023 Please see Notes tab for imaging impression.

## 2023-11-15 NOTE — Procedures (Signed)
 Lumbosacral Transforaminal Epidural Steroid Injection - Sub-Pedicular Approach with Fluoroscopic Guidance  Patient: Kathleen Hale      Date of Birth: November 26, 1975 MRN: [id number] PCP: Harvey Lynwood DASEN, MD      Visit Date: 11/14/2023   Universal Protocol:    Date/Time: 11/14/2023  Consent Given By: the patient  Position: PRONE  Additional Comments: Vital signs were monitored before and after the procedure. Patient was prepped and draped in the usual sterile fashion. The correct patient, procedure, and site was verified.   Injection Procedure Details:   Procedure diagnoses: Lumbar radiculopathy [M54.16]    Meds Administered:  Meds ordered this encounter  Medications   methylPREDNISolone acetate (DEPO-MEDROL) injection 40 mg    Laterality: Right  Location/Site: L5  Needle:5.0 in., 22 ga.  Short bevel or Quincke spinal needle  Needle Placement: Transforaminal  Findings:    -Comments: Excellent flow of contrast along the nerve, nerve root and into the epidural space.  Procedure Details: After squaring off the end-plates to get a true AP view, the C-arm was positioned so that an oblique view of the foramen as noted above was visualized. The target area is just inferior to the nose of the scotty dog or sub pedicular. The soft tissues overlying this structure were infiltrated with 2-3 ml. of 1% Lidocaine without Epinephrine.  The spinal needle was inserted toward the target using a trajectory view along the fluoroscope beam.  Under AP and lateral visualization, the needle was advanced so it did not puncture dura and was located close the 6 O'Clock position of the pedical in AP tracterory. Biplanar projections were used to confirm position. Aspiration was confirmed to be negative for CSF and/or blood. A 1-2 ml. volume of Isovue-250 was injected and flow of contrast was noted at each level. Radiographs were obtained for documentation purposes.   After attaining the desired flow of  contrast documented above, a 0.5 to 1.0 ml test dose of 0.25% Marcaine was injected into each respective transforaminal space.  The patient was observed for 90 seconds post injection.  After no sensory deficits were reported, and normal lower extremity motor function was noted,   the above injectate was administered so that equal amounts of the injectate were placed at each foramen (level) into the transforaminal epidural space.   Additional Comments:  The patient tolerated the procedure well Dressing: 2 x 2 sterile gauze and Band-Aid    Post-procedure details: Patient was observed during the procedure. Post-procedure instructions were reviewed.  Patient left the clinic in stable condition.

## 2023-11-21 ENCOUNTER — Telehealth: Payer: Self-pay | Admitting: Orthopedic Surgery

## 2023-11-21 MED ORDER — TRAMADOL HCL 50 MG PO TABS: 50.0000 mg | ORAL_TABLET | Freq: Four times a day (QID) | ORAL | 0 refills | Status: AC | PRN

## 2023-11-21 NOTE — Telephone Encounter (Signed)
 Pt called asking for a medication refill of Tramadol  50mg . Pharmacy is [facility] in [place]. Pt call back number is [phone].

## 2023-12-06 ENCOUNTER — Telehealth: Payer: Self-pay | Admitting: Orthopedic Surgery

## 2023-12-06 NOTE — Telephone Encounter (Signed)
 Please advise, injection was 11/14/23

## 2023-12-06 NOTE — Telephone Encounter (Signed)
 Pt called stating that the injections did not work with [facility] and need to know the next plan of care. Pt number is [phone].

## 2023-12-07 NOTE — Telephone Encounter (Signed)
See my chart message to pt

## 2023-12-29 ENCOUNTER — Telehealth: Payer: Self-pay | Admitting: Orthopedic Surgery

## 2023-12-29 DIAGNOSIS — M545 Low back pain, unspecified: Secondary | ICD-10-CM

## 2023-12-29 MED ORDER — TRAMADOL HCL 50 MG PO TABS: 50.0000 mg | ORAL_TABLET | Freq: Four times a day (QID) | ORAL | 0 refills | Status: AC | PRN

## 2023-12-29 NOTE — Telephone Encounter (Signed)
 Pt called requesting a refill of tramadol . Please send to [facility] [address] [facility] LI[facility]. Pt also asking for referral of [facility] for pain management. Pt number is [phone]

## 2024-01-24 ENCOUNTER — Telehealth: Payer: Self-pay | Admitting: Orthopedic Surgery

## 2024-01-24 NOTE — Telephone Encounter (Signed)
 Pt called saying that the pain management he referred her to only does the same procedure that he does,which didn't work. She is wondering if there is some kind of medication she can take and what to do. Pharmacy is [facility] on [address]. Call back number is [phone].

## 2024-02-01 ENCOUNTER — Telehealth: Payer: Self-pay | Admitting: Orthopedic Surgery

## 2024-02-01 DIAGNOSIS — M545 Low back pain, unspecified: Secondary | ICD-10-CM

## 2024-02-01 MED ORDER — TRAMADOL HCL 50 MG PO TABS: 50.0000 mg | ORAL_TABLET | Freq: Four times a day (QID) | ORAL | 0 refills | Status: AC | PRN

## 2024-02-01 NOTE — Addendum Note (Signed)
 Addended by: GEORGINA SHARPER on: 02/01/2024 04:24 PM   Modules accepted: Orders

## 2024-02-01 NOTE — Telephone Encounter (Signed)
 Pt called requesting refill of tramadol . Please send to pharmacy on file. Pt number is [phone].
